# Patient Record
Sex: Female | Born: 1935 | Race: White | Hispanic: No | Smoking: Never smoker
Health system: Southern US, Community
[De-identification: ages and names within clinical notes are randomized; demographics above are authoritative.]

## PROBLEM LIST (undated history)

## (undated) DIAGNOSIS — E039 Hypothyroidism, unspecified: Secondary | ICD-10-CM

## (undated) DIAGNOSIS — F329 Major depressive disorder, single episode, unspecified: Secondary | ICD-10-CM

## (undated) DIAGNOSIS — F0390 Unspecified dementia without behavioral disturbance: Secondary | ICD-10-CM

## (undated) DIAGNOSIS — I619 Nontraumatic intracerebral hemorrhage, unspecified: Secondary | ICD-10-CM

## (undated) DIAGNOSIS — R269 Unspecified abnormalities of gait and mobility: Secondary | ICD-10-CM

## (undated) DIAGNOSIS — R569 Unspecified convulsions: Secondary | ICD-10-CM

## (undated) DIAGNOSIS — R5381 Other malaise: Secondary | ICD-10-CM

## (undated) DIAGNOSIS — F32A Depression, unspecified: Secondary | ICD-10-CM

## (undated) DIAGNOSIS — I639 Cerebral infarction, unspecified: Secondary | ICD-10-CM

---

## 1998-03-10 ENCOUNTER — Other Ambulatory Visit: Admission: RE | Admit: 1998-03-10 | Discharge: 1998-03-10 | Payer: Self-pay | Admitting: Cardiology

## 2000-07-22 ENCOUNTER — Encounter: Payer: Self-pay | Admitting: Family Medicine

## 2000-07-22 ENCOUNTER — Encounter: Admission: RE | Admit: 2000-07-22 | Discharge: 2000-07-22 | Payer: Self-pay | Admitting: Family Medicine

## 2000-07-27 ENCOUNTER — Ambulatory Visit (HOSPITAL_COMMUNITY): Admission: RE | Admit: 2000-07-27 | Discharge: 2000-07-27 | Payer: Self-pay | Admitting: Family Medicine

## 2000-09-20 ENCOUNTER — Other Ambulatory Visit: Admission: RE | Admit: 2000-09-20 | Discharge: 2000-09-20 | Payer: Self-pay | Admitting: Family Medicine

## 2000-10-24 ENCOUNTER — Encounter: Payer: Self-pay | Admitting: Family Medicine

## 2000-10-24 ENCOUNTER — Encounter: Admission: RE | Admit: 2000-10-24 | Discharge: 2000-10-24 | Payer: Self-pay | Admitting: Family Medicine

## 2001-11-19 ENCOUNTER — Ambulatory Visit (HOSPITAL_COMMUNITY): Admission: RE | Admit: 2001-11-19 | Discharge: 2001-11-19 | Payer: Self-pay | Admitting: General Surgery

## 2001-11-19 ENCOUNTER — Encounter: Payer: Self-pay | Admitting: General Surgery

## 2002-01-04 ENCOUNTER — Encounter: Payer: Self-pay | Admitting: General Surgery

## 2002-01-04 ENCOUNTER — Encounter: Admission: RE | Admit: 2002-01-04 | Discharge: 2002-01-04 | Payer: Self-pay | Admitting: General Surgery

## 2002-01-07 ENCOUNTER — Encounter (INDEPENDENT_AMBULATORY_CARE_PROVIDER_SITE_OTHER): Payer: Self-pay | Admitting: *Deleted

## 2002-01-07 ENCOUNTER — Ambulatory Visit (HOSPITAL_BASED_OUTPATIENT_CLINIC_OR_DEPARTMENT_OTHER): Admission: RE | Admit: 2002-01-07 | Discharge: 2002-01-07 | Payer: Self-pay | Admitting: General Surgery

## 2002-09-12 ENCOUNTER — Encounter: Admission: RE | Admit: 2002-09-12 | Discharge: 2002-09-12 | Payer: Self-pay | Admitting: Family Medicine

## 2002-09-12 ENCOUNTER — Encounter: Payer: Self-pay | Admitting: Family Medicine

## 2003-12-16 ENCOUNTER — Encounter: Admission: RE | Admit: 2003-12-16 | Discharge: 2003-12-16 | Payer: Self-pay | Admitting: Family Medicine

## 2005-01-04 ENCOUNTER — Encounter: Admission: RE | Admit: 2005-01-04 | Discharge: 2005-01-04 | Payer: Self-pay | Admitting: Obstetrics and Gynecology

## 2007-10-06 ENCOUNTER — Ambulatory Visit: Payer: Self-pay | Admitting: Internal Medicine

## 2007-10-06 ENCOUNTER — Observation Stay (HOSPITAL_COMMUNITY): Admission: EM | Admit: 2007-10-06 | Discharge: 2007-10-06 | Payer: Self-pay | Admitting: Emergency Medicine

## 2010-03-20 ENCOUNTER — Emergency Department (HOSPITAL_COMMUNITY): Admission: EM | Admit: 2010-03-20 | Discharge: 2010-03-20 | Payer: Self-pay | Admitting: Emergency Medicine

## 2010-12-12 LAB — CBC
MCH: 33 pg (ref 26.0–34.0)
MCHC: 34.1 g/dL (ref 30.0–36.0)
MCV: 96.6 fL (ref 78.0–100.0)
Platelets: 173 10*3/uL (ref 150–400)
RBC: 3.68 MIL/uL — ABNORMAL LOW (ref 3.87–5.11)
RDW: 13.8 % (ref 11.5–15.5)
WBC: 4.3 10*3/uL (ref 4.0–10.5)

## 2010-12-12 LAB — URINE CULTURE: Colony Count: 50000

## 2010-12-12 LAB — BASIC METABOLIC PANEL
BUN: 18 mg/dL (ref 6–23)
CO2: 28 mEq/L (ref 19–32)
Calcium: 8.9 mg/dL (ref 8.4–10.5)
Chloride: 104 mEq/L (ref 96–112)
GFR calc Af Amer: >60 mL/min (ref >60)
GFR calc non Af Amer: >60 mL/min (ref >60)
Glucose, Bld: 90 mg/dL (ref 70–99)
Potassium: 4 mEq/L (ref 3.5–5.1)
Sodium: 137 mEq/L (ref 135–145)

## 2010-12-12 LAB — DIFFERENTIAL
Eosinophils Absolute: 0.1 10*3/uL (ref 0.0–0.7)
Eosinophils Relative: 2 % (ref 0–5)
Lymphs Abs: 0.9 10*3/uL (ref 0.7–4.0)
Monocytes Relative: 12 % (ref 3–12)
Neutro Abs: 2.7 10*3/uL (ref 1.7–7.7)

## 2010-12-12 LAB — URINALYSIS, ROUTINE W REFLEX MICROSCOPIC
Ketones, ur: NEGATIVE mg/dL
Nitrite: NEGATIVE
Protein, ur: NEGATIVE mg/dL

## 2010-12-12 LAB — URINE MICROSCOPIC-ADD ON

## 2010-12-12 LAB — VALPROIC ACID LEVEL: Valproic Acid Lvl: <10 ug/mL — ABNORMAL LOW (ref 50.0–100.0)

## 2011-02-08 NOTE — Discharge Summary (Signed)
NAMERONELLA, PLUNK                ACCOUNT NO.:  [id number]   MEDICAL RECORD NO.:  [id number]          PATIENT TYPE:  INP   LOCATION:  3017                         FACILITY:  [facility]   PHYSICIAN:  Corinna L. Lendell Caprice, MDDATE OF BIRTH:  1918-09-28   DATE OF ADMISSION:  10/06/2007  DATE OF DISCHARGE:  10/06/2007                               DISCHARGE SUMMARY   DISCHARGE DIAGNOSES:  1. Dementia with wandering and recent fall.  2. Questionable delirium, possibly worsened by Lyrica.  3. Urinary tract infection.  4. History of seizures.  5. Trigeminal neuralgia.  6. History of stroke.  7. Hypothyroidism.  8. Hyperlipidemia.   DISCHARGE MEDICATIONS:  1. Cipro 250 mg p.o. b.i.d. until gone.  2. Hold Lyrica.  3. She may continue the rest or outpatient medications which include      Synthroid 88 mcg a day.  4. Toprol XL 25 mg a day.  5. Dilantin 400 mg every Monday, Wednesday, Friday and 300 mg every      Tuesday, Thursday, Saturday, Sunday.  6. Aricept 10 mg a day.  7. Namenda 10 mg twice a day.  8. Aspirin 81 mg a day.  9. Aggrenox b.i.d.  10.Multivitamin a day.  11.Lipitor 40 mg a day.  12.Tylenol as needed for pain.   CONDITION:  Stable.   CONSULTATIONS:  None.   PROCEDURES:  None.   DIET:  Cardiac.   FOLLOW UP:  Follow up with neurologist or primary care physician as  needed.   ACTIVITY:  She is to have 24-hour supervision due to her wandering.   LABORATORY DATA:  TSH is pending.  INR is normal. CBC is significant for  a hemoglobin of 11.9, hematocrit 35, platelet count 144.  Urinalysis  showed negative nitrite, small leukocyte esterase, 3-6 white cells, many  bacteria.  A urine culture is pending.   Preliminary reading of CT brain was negative.   HISTORY AND HOSPITAL COURSE:  Ms. Deats is an 77 year old white female  who was placed on observation after having been found wandering in her  neighborhood.  She lives alone and had fallen several days ago and hit  her  head.  The daughters noticed that since Lyrica was started a month  ago, her confusion seems to be worsening.  The patient was confused and  no family members initially were available.  She was therefore admitted  to the hospital and subsequently switched to observation.  Please see  H&P for further admission details.  She was found to have a mild urinary  tract infection and was started on Cipro for this.  Also, I recommend  that her Lyrica be held for now.  Initially, initially there was no  information regarding the patient's medical history or medications.  However, when the daughters brought to medications in, it was noted that  the patient is on Dilantin.  The daughters think that she had a recent  level drawn.  Nevertheless, I have ordered a phenytoin level and an  albumin which are, at this time, pending.  I will follow these up.  I  have recommended  that the patient have 24-hour supervision and can no  longer be alone at home and they voiced understanding.      Corinna L. Lendell Caprice, MD  Electronically Signed     CLS/MEDQ  D:  10/06/2007  T:  10/07/2007  Job:  [id number]   cc:   Donia Guiles, M.D.  Georga Hacking, M.D.  Estanislado Pandy, MD

## 2011-02-08 NOTE — H&P (Signed)
Phyllis Reyes                ACCOUNT NO.:  [id number]   MEDICAL RECORD NO.:  [id number]          PATIENT TYPE:  INP   LOCATION:  3017                         FACILITY:  [facility]   PHYSICIAN:  Bevelyn Buckles. Bensimhon, MDDATE OF BIRTH:  [birth year]   DATE OF ADMISSION:  10/06/2007  DATE OF DISCHARGE:                              HISTORY & PHYSICAL   PRIMARY CARE PHYSICIAN:  Donia Guiles, M.D.   CARDIOLOGIST:  Georga Hacking, M.D.   REASON FOR ADMISSION:  Confusion/wandering/fall.   HISTORY OF PRESENT ILLNESS:  Phyllis Reyes is an 77 year old woman with a  history of dementia, coronary artery disease status post stenting,  hypothyroidism and chronic jaw neuralgia.  Apparently she recently had a  fall in her kitchen and saw Dr. Clovis Riley a couple of days ago.  Last  night she apparently was found wandering in the neighborhood and brought  to the emergency room.  She is quite a poor historian and cannot  actually tell me what she was doing but she was able to give me her  daughter's name and phone number Phyllis Reyes, [phone]) who informed  me of her past medical history.  Apparently Phyllis Reyes was recently  started on Lyrica for jaw neuralgia and there is some question of  whether that worsened her confusion.  In the ER she was found to have a  large right eye ecchymosis.  She underwent a head CT which was  apparently unremarkable.  She was also found to have a UTI and started  on ciprofloxacin.  She was hemodynamically stable.  Currently has no  complaints.   REVIEW OF SYSTEMS:  Unobtainable.   PAST MEDICAL HISTORY:  1. Coronary artery disease status post stent.  2. Dementia.  3. Hypothyroidism.  4. 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   MEDICATIONS:  Unknown.  I know she does take Lyrica and Synthroid.  Her  daughter will bring the rest of the medications.   ALLERGIES:  Unknown.   SOCIAL HISTORY:  She lives alone but is watched closely by her  daughter.  She is a nonsmoker and nondrinker.   FAMILY HISTORY:  Is unobtainable.   PHYSICAL EXAM:  GENERAL:  She is an elderly woman in no acute distress.  She is confused.  VITAL SIGNS:  Temperature is 99.3, blood pressure 124/65, heart rate is  83.  HEENT:  She has a large right eye ecchymosis, otherwise normal.  NECK:  Neck is supple.  There is no JVD.  No carotid bruits.  No  lymphadenopathies.  No thyromegaly.  CARDIAC:  She has a regular rate and rhythm with a very soft 2/6  systolic ejection murmur at the right sternal border.  No rub or gallop.  LUNGS:  Clear.  ABDOMEN:  Soft, nontender, nondistended.  There is no  hepatosplenomegaly.  No bruits.  No masses appreciated.  EXTREMITIES:  Warm with no cyanosis, clubbing or edema.  NEUROLOGIC:  She is confused.  She is nonfocal.  She moves all four  extremities without difficulty.  Cranial nerves are grossly intact.  Affect is pleasant.  LABS:  Urinalysis shows many bacteria, 3-6 white blood cells and  negative for nitrates and small leukocyte esterase.  White count is 9.4,  hemoglobin is 11.8, platelets are 142.  Sodium 140, potassium 3.9,  chloride 107, BUN of 1,0, glucose of 124.   ASSESSMENT:  1. Dementia/delirium, unsure of her baseline.  2. Urinary tract infection.  3. A recent fall with right eye ecchymosis, head CT negative.   PLAN:  We will admit her, we will hydrate her, start ciprofloxacin and  await the results of her urine culture.  Her daughter will bring her  medications to the floor.  I do question the need for possibly higher  supervision either in an assisted living center or living with her  daughter.      Bevelyn Buckles. Bensimhon, MD  Electronically Signed     DRB/MEDQ  D:  10/06/2007  T:  10/06/2007  Job:  [id number]

## 2011-02-11 NOTE — Op Note (Signed)
[facility]  Patient:    Phyllis Reyes Visit Number: [id number] MRN: [id number]          Service Type: DSU Location: [facility] Attending Physician:  Henrene Dodge Dictated by:   Anselm Pancoast. Zachery Dakins, M.D. Proc. Date: 01/07/02 Admit Date:  01/07/2002                             Operative Report  PREOPERATIVE DIAGNOSIS:   Neuroma left calf lateral.  POSTOPERATIVE DIAGNOSIS:  Neuroma left calf lateral.  OPERATION:  Excision of neuroma with neurolysis left calf lateral compartment.  SURGEON:  Anselm Pancoast. Zachery Dakins, M.D.  ANESTHESIA:  Local with sedation.  INDICATIONS:  Phyllis Reyes is an 77 year old Caucasian female referred to me approximately four months ago with a little tender area in the left lower leg that I thought on first examination was a little localized area of thrombophlebitis.  I treated her with aspirin and compressive stockings.  When I saw her approximately three weeks later the area appeared to be larger and we proceeded on with an MRI and documented that this was a neuroma.  The area if you touched it would have pain shooting to her leg, the dorsum of her left foot and because of family problems, surgery has been postponed because of other activities she is involved in.  She is now for excision for this.  On physical exam, the area is a little bit larger than 3 cm in its greatest diameter.  She is aware as well as her family members that she may have numbness and possibly a weakness in the extension of her left foot, but hopefully we can peel this mass off of the nerve without actually sacrificing the nerve.  DESCRIPTION OF PROCEDURE:  The patient was taken to the operative suite, given 1 g of Kefzol and then the foot prepped circumferentially with Betadine solution and then draped so that I could rotate the leg.  The area was a field block with 0.5% Marcaine with 0.25 Adrenalin in the area and then the  little vertical and skin incision area right over the mass was infiltrated.  An incision was made about 2-1/2 inches in length and then the underlying subcutaneous tissues was spread, and then the fascia was anesthetized after anesthetizing it.  The neuroma could easily be separated from the muscles in the lateral compartment. Next I sort of identified the nerve going into it. The tumor itself was actually going under the nerve and I kind of rotated it out and then with magnification actually sacrificed only a few of the little nerve fibers so that this could be peeled off the nerve that was kind of splayed over it.  There was very little bleeding.  No sutures or Bovie was used on the nerve.  Then with the area completely excised it measures about 3.3 x 2 x 2.2. cm.  I sent it for permanent exam.  If this would recur, we will definitely sacrifice the nerve completely the next time, but it looks benign and it has obviously been there for a significant length of time according to the patient.  For the patients incision, I closed the fascia with interrupted sutures of 4-0 Vicryl and then the skin was closed with 4-0 nylon.  I did place her in a head-up position and a Trendelenburg just to let her strain to see if there would be any venous bleeding, and there  does not appear to be any.  A little antibiotic ointment plus a Kerlix plus a 3 inch Ace was placed on the leg. She is aware that she will need to be kind of minimal activity for about the next two to three days.  I will see her in the office for a wound check later this week and will remove the stitches in approximately 10 days. Dictated by:   Anselm Pancoast. Zachery Dakins, M.D. Attending Physician:  Henrene Dodge DD:  01/07/02 TD:  01/07/02 Job: [id number]

## 2011-06-15 LAB — URINE CULTURE: Colony Count: >=100000

## 2011-06-15 LAB — CBC
MCV: 93.7
RBC: 3.72 — ABNORMAL LOW
RDW: 13.3
WBC: 9.4

## 2011-06-15 LAB — PROTIME-INR: INR: 1

## 2011-06-15 LAB — I-STAT 8, (EC8 V) (CONVERTED LAB)
Acid-Base Excess: 1
Bicarbonate: 26.9 — ABNORMAL HIGH
Chloride: 107
Glucose, Bld: 124 — ABNORMAL HIGH
HCT: 35 — ABNORMAL LOW
Hemoglobin: 11.9 — ABNORMAL LOW
Sodium: 140

## 2011-06-15 LAB — ALBUMIN: Albumin: 3.7

## 2011-06-15 LAB — URINALYSIS, ROUTINE W REFLEX MICROSCOPIC
Glucose, UA: NEGATIVE
Nitrite: NEGATIVE

## 2011-06-15 LAB — T4, FREE: Free T4: 0.9

## 2011-06-15 LAB — URINE MICROSCOPIC-ADD ON

## 2011-11-15 IMAGING — CT CT HEAD W/O CM
4 of 6 series · 17 of 47 positions shown, 19 images · non-contrast
Comparison: 10/06/2007.

CT HEAD

CLINICAL DATA: Fall.  Pain.  Bruising over the right forehead.

CT HEAD WITHOUT CONTRAST
CT CERVICAL SPINE WITHOUT CONTRAST
TECHNIQUE: Multidetector CT imaging of the head and cervical spine
was performed following the standard protocol without intravenous
contrast.  Multiplanar CT image reconstructions of the cervical
spine were also generated.

[Series 3: head trauma 2.4 h60s · axial · 0.45mm/px · z∈[-122,-50]mm · 4 of 72 slices shown]
[im 11/72  brain]
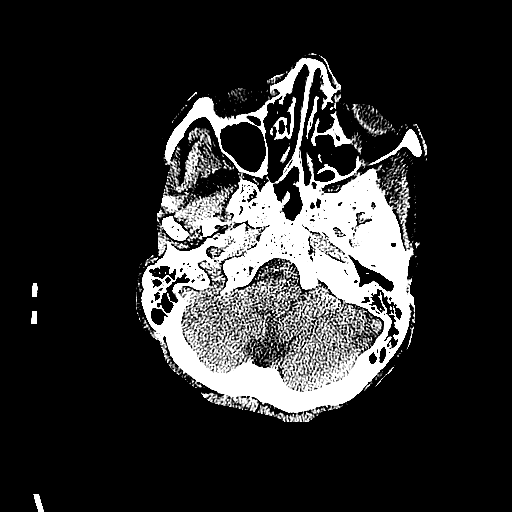
[im 21/72  brain]
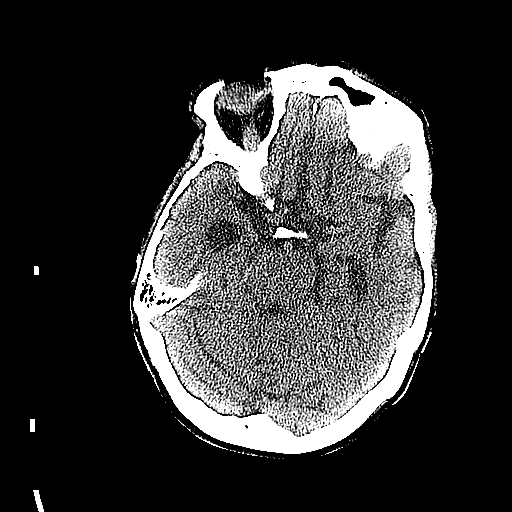
[im 31/72  brain]
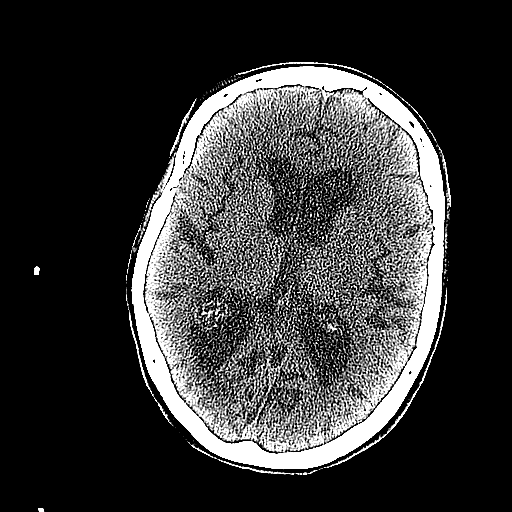
[im 41/72  brain]
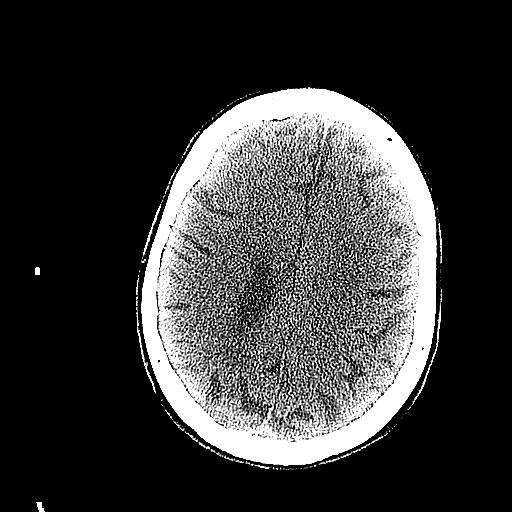

[Series 602: cor · coronal · 0.34mm/px · 3 of 41 slices shown]
[im 14/41  brain]
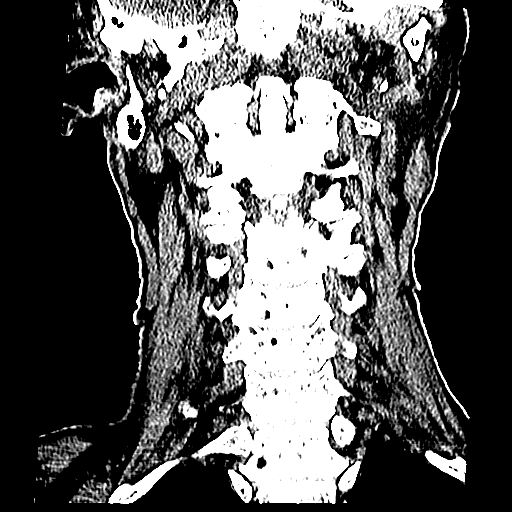
[im 18/41  brain]
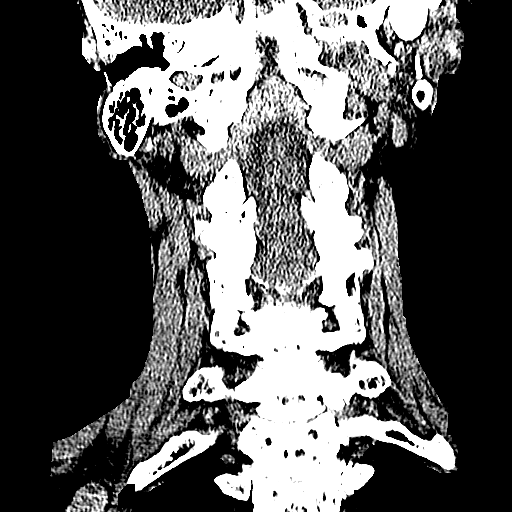
[im 23/41  brain]
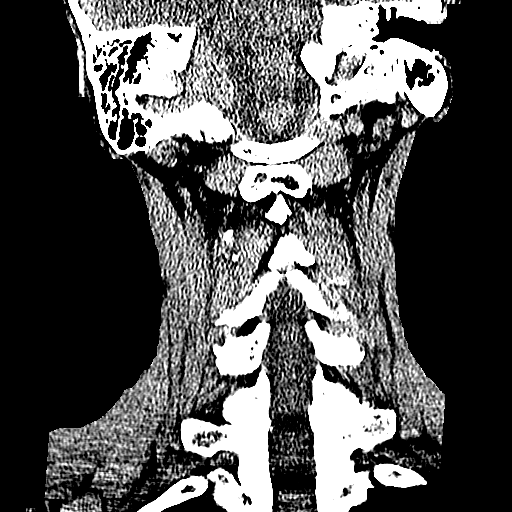

[Series 603: ortho ax · axial · 0.30mm/px · z∈[-296,-188]mm · 7 of 87 slices shown, 9 images]
[im 11/87  brain]
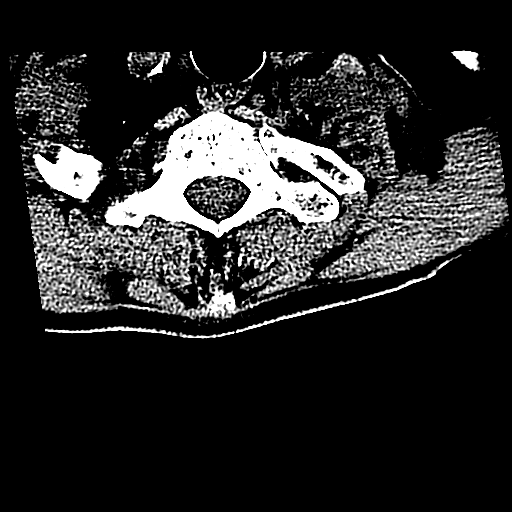
[im 11/87  bone]
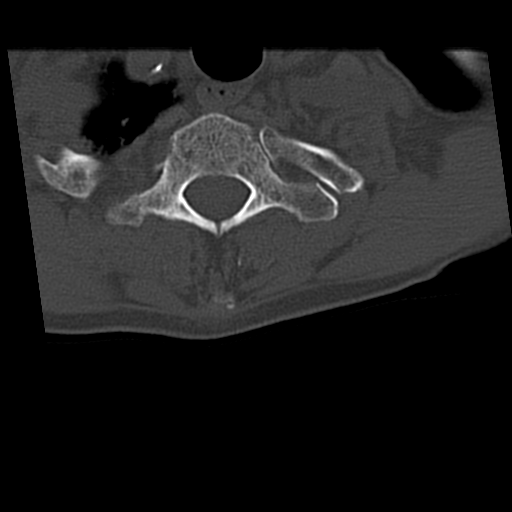
[im 22/87  brain]
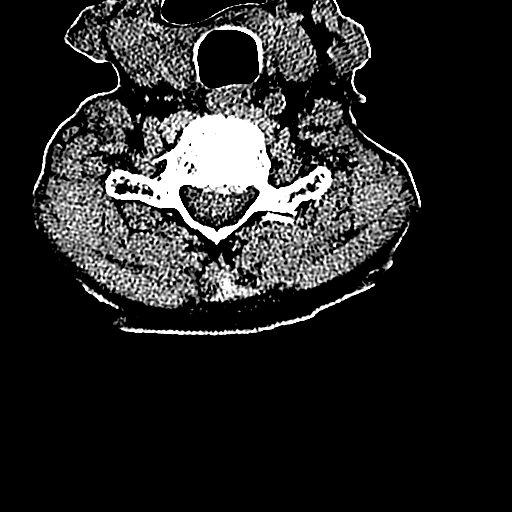
[im 33/87  brain]
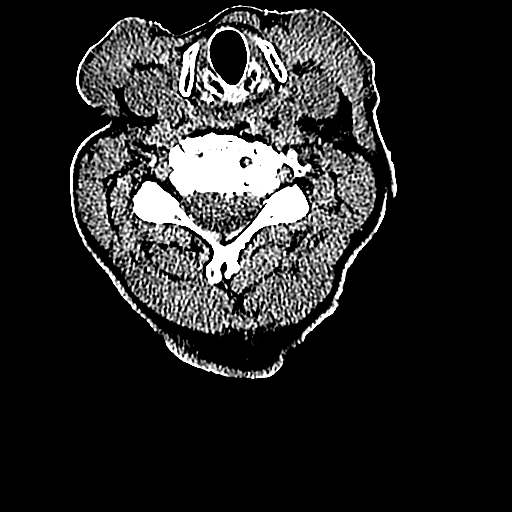
[im 44/87  brain]
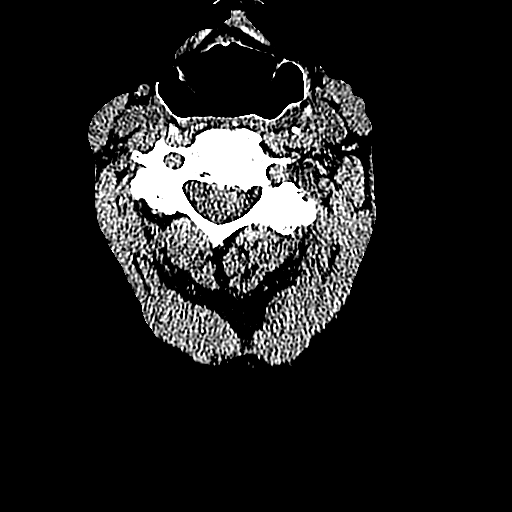
[im 54/87  brain]
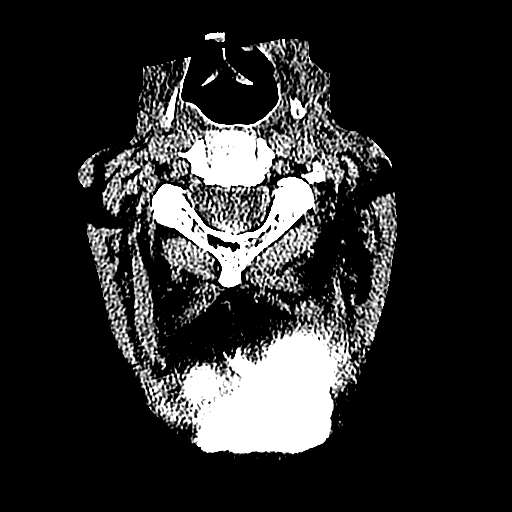
[im 54/87  bone]
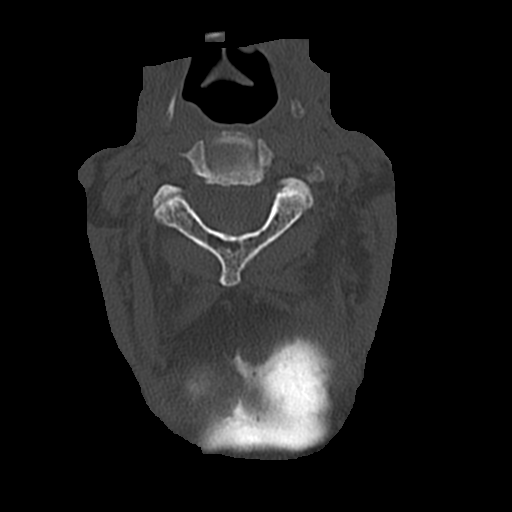
[im 65/87  brain]
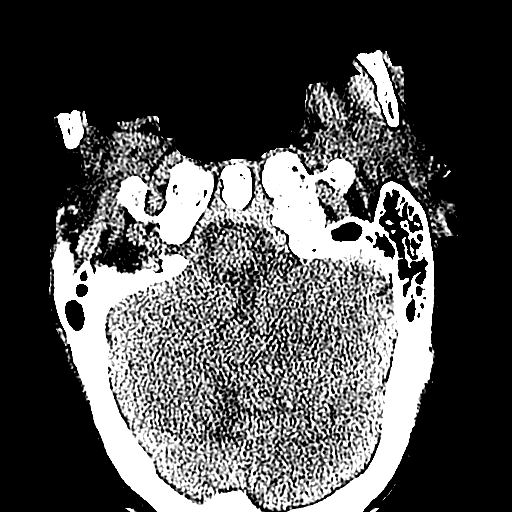
[im 76/87  brain]
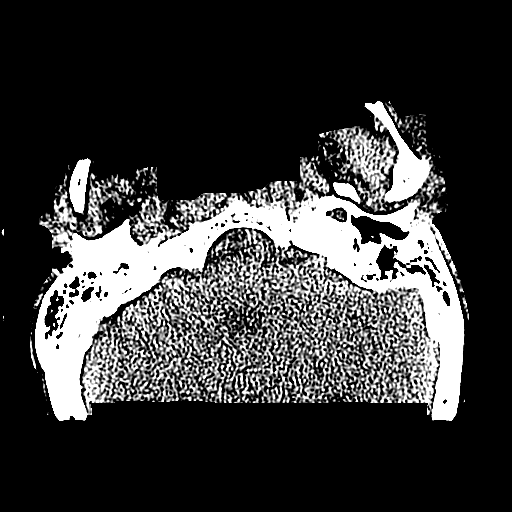

[Series 604: sag · sagittal · 0.34mm/px · 3 of 48 slices shown]
[im 16/48  brain]
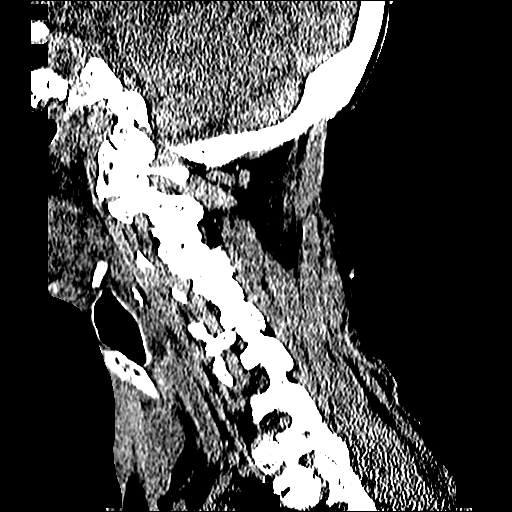
[im 24/48  brain]
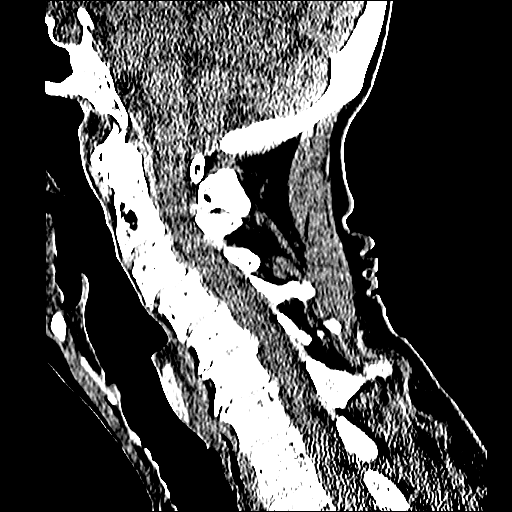
[im 32/48  brain]
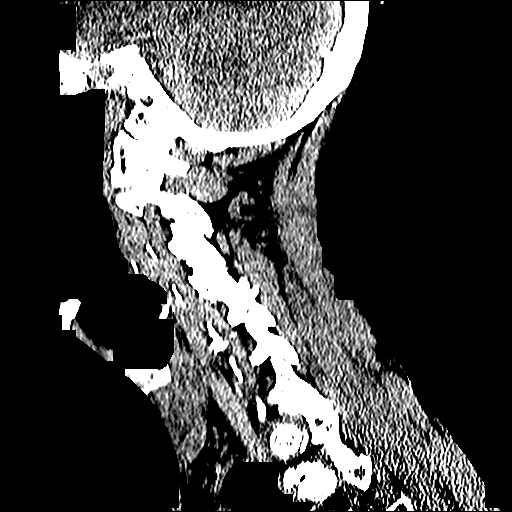

[17 of 47 positions shown; findings below may reference images not displayed]

FINDINGS: Moderate atrophy and white matter disease is stable.  No
acute cortical infarct, hemorrhage, mass, hydrocephalus, or
significant extra-axial fluid collection is present.  The paranasal
sinuses and mastoid air cells are clear.  A supraorbital right
frontal scalp hematoma is present without underlying fracture.
IMPRESSION: 1.  Stable atrophy and white matter disease.
2.  Minimal soft tissue swelling within the right frontal
supraorbital scalp without underlying fracture.

CT CERVICAL SPINE
FINDINGS: The cervical spine is imaged from skull base through the
midbody of T2.  Slight degenerative anterolisthesis of C4-5 is
stable.  The vertebral body heights are preserved.  No acute
fracture or traumatic subluxation is evident.  Moderate left
foraminal stenosis at C3-4 and C5-6 is stable.  Osseous right
foraminal stenosis at C7-T1 is stable, secondary to facet
arthropathy.  Multiple lytic lesions throughout the cervical spine
are relatively stable.
IMPRESSION: 1.  No acute fracture or traumatic subluxation.
2.  Stable moderate spondylosis of the cervical spine.
3.  Multiple lytic lesions throughout the cervical spine.  This
again raises concern for metastatic disease or myeloma.  However,
the findings have been relatively stable over last 2 years.  This
may be treated disease.

## 2012-08-09 ENCOUNTER — Encounter (HOSPITAL_COMMUNITY): Payer: Self-pay | Admitting: *Deleted

## 2012-08-09 ENCOUNTER — Emergency Department (HOSPITAL_COMMUNITY): Payer: Medicare Other

## 2012-08-09 ENCOUNTER — Emergency Department (HOSPITAL_COMMUNITY)
Admission: EM | Admit: 2012-08-09 | Discharge: 2012-08-09 | Disposition: A | Discharge status: Home / Self Care | Payer: Medicare Other | Attending: Emergency Medicine | Admitting: Emergency Medicine

## 2012-08-09 DIAGNOSIS — Y939 Activity, unspecified: Secondary | ICD-10-CM | POA: Insufficient documentation

## 2012-08-09 DIAGNOSIS — F329 Major depressive disorder, single episode, unspecified: Secondary | ICD-10-CM | POA: Insufficient documentation

## 2012-08-09 DIAGNOSIS — IMO0002 Reserved for concepts with insufficient information to code with codable children: Secondary | ICD-10-CM

## 2012-08-09 DIAGNOSIS — Y929 Unspecified place or not applicable: Secondary | ICD-10-CM | POA: Insufficient documentation

## 2012-08-09 DIAGNOSIS — F3289 Other specified depressive episodes: Secondary | ICD-10-CM | POA: Insufficient documentation

## 2012-08-09 DIAGNOSIS — Z79899 Other long term (current) drug therapy: Secondary | ICD-10-CM | POA: Insufficient documentation

## 2012-08-09 DIAGNOSIS — R296 Repeated falls: Secondary | ICD-10-CM | POA: Insufficient documentation

## 2012-08-09 DIAGNOSIS — Z23 Encounter for immunization: Secondary | ICD-10-CM | POA: Insufficient documentation

## 2012-08-09 DIAGNOSIS — W19XXXA Unspecified fall, initial encounter: Secondary | ICD-10-CM

## 2012-08-09 DIAGNOSIS — G40909 Epilepsy, unspecified, not intractable, without status epilepticus: Secondary | ICD-10-CM | POA: Insufficient documentation

## 2012-08-09 DIAGNOSIS — S0180XA Unspecified open wound of other part of head, initial encounter: Secondary | ICD-10-CM | POA: Insufficient documentation

## 2012-08-09 DIAGNOSIS — Z8669 Personal history of other diseases of the nervous system and sense organs: Secondary | ICD-10-CM | POA: Insufficient documentation

## 2012-08-09 DIAGNOSIS — Z8673 Personal history of transient ischemic attack (TIA), and cerebral infarction without residual deficits: Secondary | ICD-10-CM | POA: Insufficient documentation

## 2012-08-09 DIAGNOSIS — E039 Hypothyroidism, unspecified: Secondary | ICD-10-CM | POA: Insufficient documentation

## 2012-08-09 HISTORY — DX: Depression, unspecified: F32.A

## 2012-08-09 HISTORY — DX: Unspecified convulsions: R56.9

## 2012-08-09 HISTORY — DX: Other malaise: R53.81

## 2012-08-09 HISTORY — DX: Cerebral infarction, unspecified: I63.9

## 2012-08-09 HISTORY — DX: Major depressive disorder, single episode, unspecified: F32.9

## 2012-08-09 HISTORY — DX: Hypothyroidism, unspecified: E03.9

## 2012-08-09 HISTORY — DX: Unspecified dementia, unspecified severity, without behavioral disturbance, psychotic disturbance, mood disturbance, and anxiety: F03.90

## 2012-08-09 HISTORY — DX: Nontraumatic intracerebral hemorrhage, unspecified: I61.9

## 2012-08-09 HISTORY — DX: Unspecified abnormalities of gait and mobility: R26.9

## 2012-08-09 MED ORDER — TETANUS-DIPHTH-ACELL PERTUSSIS 5-2.5-18.5 LF-MCG/0.5 IM SUSP
0.5000 mL | Freq: Once | INTRAMUSCULAR | Status: AC
  Administered 2012-08-09: 0.5 mL via INTRAMUSCULAR
  Filled 2012-08-09: qty 0.5

## 2012-08-09 MED ORDER — LIDOCAINE HCL 2 % IJ SOLN
10.0000 mL | Freq: Once | INTRAMUSCULAR | Status: AC
  Administered 2012-08-09: 200 mg via INTRADERMAL

## 2012-08-09 NOTE — ED Provider Notes (Signed)
History     CSN: [id number]  Arrival date & time 08/09/12  7829   First MD Initiated Contact with Patient 08/09/12 1851      Chief Complaint  Patient presents with  . Head Laceration    (Consider location/radiation/quality/duration/timing/severity/associated sxs/prior treatment) Patient is a 78 y.o. female presenting with scalp laceration. The history is provided by the patient, the EMS personnel and the nursing home. The history is limited by the condition of the patient. No language interpreter was used.  Head Laceration This is a new problem. The current episode started today. Pertinent negatives include no fever.   Cc: 78 yo severly demented female coming from nursing home after she fell head first out of a w/c sustaining a 3cm laceration to R forehead.  Unable to communicate with patient due to dementia.  PEARL but no tracking or focusing.  The fall was witnessed by staff.  No seizure activity today.  pmh stroke , seizures, depression, dementia, hypothyroid, debility, ICH.  Patient is on keppra and depakote for seizures.     Past Medical History  Diagnosis Date  . Stroke   . Seizures   . Depression   . Dementia   . Hypothyroidism   . Intracerebral hemorrhage   . CVA (cerebral infarction)   . Debility   . Gait disorder     History reviewed. No pertinent past surgical history.  History reviewed. No pertinent family history.  History  Substance Use Topics  . Smoking status: Not on file  . Smokeless tobacco: Not on file  . Alcohol Use: No    OB History    Grav Para Term Preterm Abortions TAB SAB Ect Mult Living                  Review of Systems  Unable to perform ROS Constitutional: Negative for fever.    Allergies  Lorazepam  Home Medications   Current Outpatient Rx  Name  Route  Sig  Dispense  Refill  . AMOXICILLIN-POT CLAVULANATE 500-125 MG PO TABS   Oral   Take 1 tablet by mouth 2 (two) times daily. X 10 days. Started on 08/06/12         .  LACRI-LUBE S.O.P. OP OINT   Ophthalmic   Apply 1 application to eye 3 (three) times daily. To right eye         . DIVALPROEX SODIUM 125 MG PO CPSP   Oral   Take 750 mg by mouth at bedtime.         Marland Kitchen DOCUSATE SODIUM 50 MG/5ML PO LIQD   Oral   Take 100 mg by mouth 2 (two) times daily.         Marland Kitchen HYDROXYZINE HCL 25 MG PO TABS   Oral   Take 25 mg by mouth daily. At 2pm.         . IPRATROPIUM-ALBUTEROL 0.5-2.5 (3) MG/3ML IN SOLN   Nebulization   Take 3 mLs by nebulization every 6 (six) hours. X 7 days. Started on 08/06/12.         Marland Kitchen BOOST PLUS PO LIQD   Oral   Take 237 mLs by mouth 3 (three) times daily with meals.         Marland Kitchen LACTULOSE 10 GM/15ML PO SOLN   Oral   Take 30 g by mouth 2 (two) times daily.         Marland Kitchen LEVETIRACETAM 100 MG/ML PO SOLN   Oral   Take 500 mg by  mouth 2 (two) times daily.         Marland Kitchen LEVOTHYROXINE SODIUM 100 MCG PO TABS   Oral   Take 100 mcg by mouth daily.         Marland Kitchen LORATADINE 10 MG PO TABS   Oral   Take 10 mg by mouth daily.         Marland Kitchen MIRTAZAPINE 7.5 MG PO TABS   Oral   Take 7.5 mg by mouth at bedtime.         . ADULT MULTIVITAMIN LIQUID CH   Oral   Take 15 mLs by mouth daily.         Marland Kitchen PROBIOTIC DAILY PO   Oral   Take 1 tablet by mouth 2 (two) times daily. X 10 days. Started on 08/06/12.         Marland Kitchen BENEPROTEIN PO POWD   Oral   Take 1 scoop by mouth 2 (two) times daily.           BP 122/87  Pulse 83  Temp 98.6 F (37 C) (Oral)  Resp 18  SpO2 100%  Physical Exam  Nursing note and vitals reviewed. Constitutional: She is oriented to person, place, and time. She appears well-developed and well-nourished.  HENT:  Head: Normocephalic and atraumatic.  Eyes: Conjunctivae normal and EOM are normal. Pupils are equal, round, and reactive to light.  Neck: Normal range of motion. Neck supple.  Cardiovascular: Normal rate.   Pulmonary/Chest: Effort normal.  Abdominal: Soft.  Musculoskeletal: Normal range of motion.  She exhibits tenderness.  Neurological: She is alert and oriented to person, place, and time. She has normal reflexes.  Skin: Skin is warm and dry.       3cm laceration to R forehead  Psychiatric: She has a normal mood and affect.    ED Course  LACERATION REPAIR Date/Time: 08/09/2012 8:45 AM Performed by: Remi Haggard Authorized by: Remi Haggard Risks and benefits: risks, benefits and alternatives were discussed Consent given by: guardian Patient identity confirmed: verbally with patient, arm band, provided demographic data and hospital-assigned identification number Body area: head/neck Location details: forehead Laceration length: 3 cm Tendon involvement: none Nerve involvement: none Vascular damage: no Anesthesia: local infiltration Local anesthetic: lidocaine 2% without epinephrine Anesthetic total: 6 ml Patient sedated: no Preparation: Patient was prepped and draped in the usual sterile fashion. Irrigation solution: saline Amount of cleaning: standard Debridement: none Skin closure: 6-0 Prolene Number of sutures: 3 Technique: simple Approximation: close Approximation difficulty: simple Dressing: 4x4 sterile gauze Patient tolerance: Patient tolerated the procedure well with no immediate complications.   (including critical care time)  Labs Reviewed - No data to display No results found.   No diagnosis found.    MDM  Laceration after fall from w/c at nursing facility.  Severe dementia.  CT of head and cervical spine unremarkable and reviewed by myself. Laceration repair to L forehead.  Shared visit with Dr. Rulon Abide.  Ready for transport back to facility.          Remi Haggard, NP 08/10/12 [phone]

## 2012-08-09 NOTE — ED Notes (Signed)
Patient fell forward out of wheelchair.  Laceration to forehead, dressing intact on arrival.  No LOC- No other complaints

## 2012-08-09 NOTE — ED Notes (Addendum)
Pt returned from CT.  A.O. X 1. Daughter at bedside. Updated on plan of care: aware that films have been taken and EDP will need to read them. EDP will discuss results when available.  Pt laying in bed. Soft restraint in place. Skin warm and dry. Capillary refill positive. NAD. No further needs at this time.

## 2012-08-09 NOTE — ED Notes (Signed)
[facility] contacted for transport 

## 2012-08-11 NOTE — ED Provider Notes (Signed)
Medical screening examination/treatment/procedure(s) were performed by non-physician practitioner and as supervising physician I was immediately available for consultation/collaboration.  Jones Skene, M.D.     Jones Skene, MD 08/11/12 1425

## 2012-12-28 ENCOUNTER — Non-Acute Institutional Stay (SKILLED_NURSING_FACILITY): Payer: Medicare Other | Admitting: Internal Medicine

## 2012-12-28 DIAGNOSIS — H109 Unspecified conjunctivitis: Secondary | ICD-10-CM

## 2012-12-28 DIAGNOSIS — E039 Hypothyroidism, unspecified: Secondary | ICD-10-CM

## 2013-01-04 ENCOUNTER — Non-Acute Institutional Stay (SKILLED_NURSING_FACILITY): Payer: Medicare Other | Admitting: Internal Medicine

## 2013-01-04 DIAGNOSIS — F0150 Vascular dementia without behavioral disturbance: Secondary | ICD-10-CM

## 2013-01-04 DIAGNOSIS — R569 Unspecified convulsions: Secondary | ICD-10-CM

## 2013-01-04 DIAGNOSIS — E039 Hypothyroidism, unspecified: Secondary | ICD-10-CM

## 2013-01-04 DIAGNOSIS — J309 Allergic rhinitis, unspecified: Secondary | ICD-10-CM

## 2013-01-09 NOTE — Progress Notes (Signed)
Patient ID: Phyllis Reyes, female   DOB: [birth year], 79 y.o.   MRN: [id number]        PROGRESS NOTE  DATE:   12/28/2012    FACILITY:  [facility]   LEVEL OF CARE: SNF  Acute Visit  CHIEF COMPLAINT:  Manage hypothyroidism and bilateral eye discharge.   HISTORY OF PRESENT ILLNESS: I was requested by the staff to assess the patient regarding above problem(s):  HYPOTHYROIDISM: The hypothyroidism remains stable. No complications noted from the medications presently being used.  The staff denies fatigue or constipation.  Last TSH:  On 12/26/2012, TSH 0.366.  BILATERAL EYE DISCHARGE:  Staff report that patient has yellow, mucus-type drainage from bilateral eyes, noted for several days.  Staff cannot identify precipitating or alleviating factors.  Symptoms are constant.    PAST MEDICAL HISTORY : Reviewed.  No changes.  CURRENT MEDICATIONS: Reviewed per [facility]  REVIEW OF SYSTEMS:  Unobtainable due to dementia.   PHYSICAL EXAMINATION  GENERAL: no acute distress, thin body habitus EYES: There is bilateral yellow drainage from eyes.  No conjunctival erythema.  conjunctivae normal, sclerae normal, normal eye lids NECK: supple, trachea midline, no neck masses, no thyroid tenderness, no thyromegaly LYMPHATICS: no LAN in the neck, no supraclavicular LAN RESPIRATORY: breathing is even & unlabored, BS CTAB CARDIAC: RRR, no murmur,no extra heart sounds EDEMA/VARICOSITIES:  +1 bilateral lower extremity edema  ARTERIAL:  pedal pulses diminished  GI: abdomen soft, normal BS, no masses, no tenderness, no hepatomegaly, no splenomegaly PSYCHIATRIC: the patient is alert & oriented to person, affect & behavior appropriate  ASSESSMENT/PLAN:  Bilateral conjunctivitis.  New problem.  Start gentamicin ophthalmic 0.3%, 2 drops to each eye b.i.d. For one week.   Hypothyroidism.  Uncontrolled problem.  Decrease Synthroid to 75 mcg q.d.  Check TSH in six weeks.   CPT CODE: 40981

## 2013-01-22 NOTE — Progress Notes (Signed)
Patient ID: Phyllis Reyes, female   DOB: [birth year], 79 y.o.   MRN: [id number]        PROGRESS NOTE  DATE:  01/04/2013  FACILITY: [facility]   LEVEL OF CARE: SNF  Routine Visit  CHIEF COMPLAINT:  Manage seizure disorder and hypothyroidism.    HISTORY OF PRESENT ILLNESS:  REASSESSMENT OF ONGOING PROBLEM(S):  HYPOTHYROIDISM: The hypothyroidism remains stable. No complications noted from the medications presently being used.  The staff denies fatigue or constipation.  Last TSH:  In 12/2012, TSH was 0.366.  Patient is a poor historian due to dementia.   SEIZURE DISORDER: The patient's seizure disorder remains stable. No complications reported from the medications presently being used. Staff do not report any recent seizure activity.    PAST MEDICAL HISTORY : Reviewed.  No changes.  CURRENT MEDICATIONS: Reviewed per [facility]  REVIEW OF SYSTEMS:  Unobtainable due to dementia.   PHYSICAL EXAMINATION  VS:  T  97.5     P 76     RR 18      BP 102/70    POX %     WT (Lb) 130  GENERAL: no acute distress, thin body habitus NECK: supple, trachea midline, no neck masses, no thyroid tenderness, no thyromegaly RESPIRATORY: breathing is even & unlabored, BS CTAB CARDIAC: RRR, no murmur,no extra heart sounds, no edema GI: abdomen soft, normal BS, no masses, no tenderness, no hepatomegaly, no splenomegaly PSYCHIATRIC: the patient is alert & disoriented, decreased affect and mood   LABS/RADIOLOGY: Total protein 5.4, otherwise CMP normal.   Depakote level 32, Dilantin level less than 0.6.   09/2012:  CBC normal.    ASSESSMENT/PLAN:  Seizure disorder.  Well controlled.  Hypothyroidism.  Synthroid was decreased.  Recheck in six weeks pending.   Vascular dementia.  Advanced.   Currently Hospice patient.    Allergic rhinitis.  Continue Claritin.    Constipation.  Continue current laxatives.   Failure to thrive.  Continue nutritional supplements and Remeron.  Weight improved.   CPT CODE:  56213

## 2013-01-24 ENCOUNTER — Non-Acute Institutional Stay (SKILLED_NURSING_FACILITY): Payer: Medicare Other | Admitting: Adult Health

## 2013-01-24 ENCOUNTER — Encounter: Payer: Self-pay | Admitting: Adult Health

## 2013-01-24 DIAGNOSIS — K5900 Constipation, unspecified: Secondary | ICD-10-CM | POA: Insufficient documentation

## 2013-01-24 DIAGNOSIS — R569 Unspecified convulsions: Secondary | ICD-10-CM

## 2013-01-24 DIAGNOSIS — F411 Generalized anxiety disorder: Secondary | ICD-10-CM | POA: Insufficient documentation

## 2013-01-24 DIAGNOSIS — R627 Adult failure to thrive: Secondary | ICD-10-CM | POA: Insufficient documentation

## 2013-01-24 DIAGNOSIS — F0150 Vascular dementia without behavioral disturbance: Secondary | ICD-10-CM | POA: Insufficient documentation

## 2013-01-24 DIAGNOSIS — E039 Hypothyroidism, unspecified: Secondary | ICD-10-CM | POA: Insufficient documentation

## 2013-01-24 DIAGNOSIS — I639 Cerebral infarction, unspecified: Secondary | ICD-10-CM | POA: Insufficient documentation

## 2013-01-24 DIAGNOSIS — I6350 Cerebral infarction due to unspecified occlusion or stenosis of unspecified cerebral artery: Secondary | ICD-10-CM

## 2013-01-24 MED ORDER — LEVETIRACETAM 100 MG/ML PO SOLN: 750.0000 mg | Freq: Two times a day (BID) | ORAL | Status: AC

## 2013-01-24 NOTE — Progress Notes (Signed)
Patient ID: Phyllis Reyes, female   DOB: [birth year], 79 y.o.   MRN: [id number]  Chief Complaint  Patient presents with  . Acute Visit    seizure    HPI:   Staff reports that she is having some breakthrough seizure activity present. She is taking keppra 500 mg twice daily she is on depakote for her mood stabilization which is not therapeutic for seizure control she is unable to participate in the hpi or ros. She continues to be followed by hospice care   Past Medical History  Diagnosis Date  . Stroke   . Seizures   . Depression   . Dementia   . Hypothyroidism   . Intracerebral hemorrhage   . CVA (cerebral infarction)   . Debility   . Gait disorder     No past surgical history on file.  VITAL SIGNS BP 117/79  Pulse 68  Ht 5\' 5"  (1.651 m)  Wt 130 lb (58.968 kg)  BMI 21.63 kg/m2   Patient's Medications  New Prescriptions   No medications on file  Previous Medications   ARTIFICIAL TEAR OINTMENT (LACRI-LUBE S.O.P.) OINT    Apply 1 application to eye 3 (three) times daily. To right eye   DIVALPROEX (DEPAKOTE SPRINKLE) 125 MG CAPSULE    Take 500 mg by mouth 2 (two) times daily.    DOCUSATE (COLACE) 50 MG/5ML LIQUID    Take 100 mg by mouth 2 (two) times daily.   FUROSEMIDE (LASIX) 20 MG TABLET    Take 20 mg by mouth daily.   HYDROXYZINE (ATARAX/VISTARIL) 25 MG TABLET    Take 25 mg by mouth daily. At 2pm.   IPRATROPIUM-ALBUTEROL (DUONEB) 0.5-2.5 (3) MG/3ML SOLN    Take 3 mLs by nebulization every 6 (six) hours. X 7 days. Started on 08/06/12.   LACTOSE FREE NUTRITION (BOOST PLUS) LIQD    Take 237 mLs by mouth 3 (three) times daily with meals.   LACTULOSE (CHRONULAC) 10 GM/15ML SOLUTION    Take 30 mLs by mouth 2 (two) times daily.    LEVETIRACETAM (KEPPRA) 100 MG/ML SOLUTION    Take 500 mg by mouth 2 (two) times daily.   LEVOTHYROXINE (SYNTHROID, LEVOTHROID) 100 MCG TABLET    Take 75 mcg by mouth daily.    LORATADINE (CLARITIN) 10 MG TABLET    Take 10 mg by mouth daily.   MIRTAZAPINE (REMERON) 7.5 MG TABLET    Take 7.5 mg by mouth at bedtime.   MORPHINE (ROXANOL) 20 MG/ML CONCENTRATED SOLUTION    Take 5 mg by mouth every 4 (four) hours as needed for pain.   MULTIPLE VITAMIN (MULTIVITAMIN) LIQD    Take 15 mLs by mouth daily.   PROTEIN SUPPLEMENT (RESOURCE BENEPROTEIN) POWD    Take 1 scoop by mouth 2 (two) times daily.  Modified Medications   No medications on file  Discontinued Medications   AMOXICILLIN-CLAVULANATE (AUGMENTIN) 500-125 MG PER TABLET    Take 1 tablet by mouth 2 (two) times daily. X 10 days. Started on 08/06/12   PROBIOTIC PRODUCT (PROBIOTIC DAILY PO)    Take 1 tablet by mouth 2 (two) times daily. X 10 days. Started on 08/06/12.    SIGNIFICANT DIAGNOSTIC EXAMS  10-11-12: wbc 4.6; hgb 13.7; hct 40.6; mcv 89; plt 147  12-27-12: glucose 91; bun 2; creat 0.87; k+ 4.8; na++ 143; liver normal albumin 3.4; tsh 0.366 depakote 32(mood)  Review of Systems  Unable to perform ROS: dementia     Physical Exam  Constitutional: She appears  well-nourished.  Thin   Neck: Neck supple.  Respiratory: Effort normal and breath sounds normal.  GI: Soft. Bowel sounds are normal.  Musculoskeletal:  Able to move extremities  Neurological: She is alert.  Alert to self only  Skin: Skin is warm and dry.       ASSESSMENT/ PLAN:    Seizures Since she does have an occasional seizure will increase her keppra from 500 mg twice daily to 750 mg twice daily and will continue to monitor her status

## 2013-01-24 NOTE — Assessment & Plan Note (Signed)
Since she does have an occasional seizure will increase her keppra from 500 mg twice daily to 750 mg twice daily and will continue to monitor her status

## 2013-01-25 ENCOUNTER — Non-Acute Institutional Stay (SKILLED_NURSING_FACILITY): Payer: Medicare Other | Admitting: Internal Medicine

## 2013-01-25 DIAGNOSIS — J309 Allergic rhinitis, unspecified: Secondary | ICD-10-CM

## 2013-01-25 DIAGNOSIS — R569 Unspecified convulsions: Secondary | ICD-10-CM

## 2013-01-25 DIAGNOSIS — E039 Hypothyroidism, unspecified: Secondary | ICD-10-CM

## 2013-01-25 DIAGNOSIS — F0150 Vascular dementia without behavioral disturbance: Secondary | ICD-10-CM

## 2013-01-26 NOTE — Progress Notes (Signed)
Patient ID: Phyllis Reyes, female   DOB: 1919/03/12, 79 y.o.   MRN: [id number]        PROGRESS NOTE  DATE:  01/25/2013  FACILITY: [facility]   LEVEL OF CARE: SNF  Routine Visit  CHIEF COMPLAINT:  Manage seizure disorder and hypothyroidism.    HISTORY OF PRESENT ILLNESS:  REASSESSMENT OF ONGOING PROBLEM(S):  HYPOTHYROIDISM: The hypothyroidism remains stable. No complications noted from the medications presently being used.  The staff deny fatigue or constipation.  Last TSH:  In 12/2012, TSH was 0.366.  Patient is a poor historian due to dementia.   SEIZURE DISORDER: The patient's seizure disorder remains stable. No complications reported from the medications presently being used. Staff do not report any recent seizure activity.    PAST MEDICAL HISTORY : Reviewed.  No changes.  CURRENT MEDICATIONS: Reviewed per [facility]  REVIEW OF SYSTEMS:  Unobtainable due to dementia.   PHYSICAL EXAMINATION  VS:  T  98.1     P 94     RR 18      BP 136/90    POX %     WT (Lb) 130  GENERAL: no acute distress, thin body habitus NECK: supple, trachea midline, no neck masses, no thyroid tenderness, no thyromegaly RESPIRATORY: breathing is even & unlabored, BS CTAB CARDIAC: RRR, no murmur,no extra heart sounds, no edema GI: abdomen soft, normal BS, no masses, no tenderness, no hepatomegaly, no splenomegaly PSYCHIATRIC: the patient is alert & disoriented, decreased affect and mood   LABS/RADIOLOGY: 4/14 Total protein 5.4, otherwise CMP normal.   Depakote level 32, Dilantin level less than 0.6.   09/2012:  CBC normal.    ASSESSMENT/PLAN:  Seizure disorder.  Well controlled.  Keppra was increased.  Hypothyroidism.  Synthroid was decreased.  Recheck in six weeks pending.   Vascular dementia.  Advanced.   Currently Hospice patient.    Allergic rhinitis.  Continue Claritin.    Constipation.  Continue current laxatives.   Failure to thrive.  Continue nutritional supplements and Remeron.   Weight improved.   CPT CODE: 84696

## 2013-02-21 ENCOUNTER — Non-Acute Institutional Stay (SKILLED_NURSING_FACILITY): Payer: Medicare Other | Admitting: Adult Health

## 2013-02-21 DIAGNOSIS — R05 Cough: Secondary | ICD-10-CM

## 2013-02-27 ENCOUNTER — Non-Acute Institutional Stay (SKILLED_NURSING_FACILITY): Payer: Medicare Other | Admitting: Internal Medicine

## 2013-02-27 DIAGNOSIS — J309 Allergic rhinitis, unspecified: Secondary | ICD-10-CM

## 2013-02-27 DIAGNOSIS — F0150 Vascular dementia without behavioral disturbance: Secondary | ICD-10-CM

## 2013-02-27 DIAGNOSIS — E039 Hypothyroidism, unspecified: Secondary | ICD-10-CM

## 2013-02-27 DIAGNOSIS — R569 Unspecified convulsions: Secondary | ICD-10-CM

## 2013-02-27 NOTE — Progress Notes (Signed)
Patient ID: Phyllis Reyes, female   DOB: May 08, 1919, 79 y.o.   MRN: [id number]        PROGRESS NOTE  DATE:  02/27/2013  FACILITY: [facility]   LEVEL OF CARE: SNF  Routine Visit  CHIEF COMPLAINT:  Manage seizure disorder and hypothyroidism.    HISTORY OF PRESENT ILLNESS:  REASSESSMENT OF ONGOING PROBLEM(S):  HYPOTHYROIDISM: The hypothyroidism remains stable. No complications noted from the medications presently being used.  The staff deny fatigue or constipation.  Last TSH:  In 12/2012, TSH was 0.366.  Patient is a poor historian due to dementia.   SEIZURE DISORDER: The patient's seizure disorder remains stable. No complications reported from the medications presently being used. Staff do not report any recent seizure activity.    PAST MEDICAL HISTORY : Reviewed.  No changes.  CURRENT MEDICATIONS: Reviewed per [facility]  REVIEW OF SYSTEMS:  Unobtainable due to dementia.   PHYSICAL EXAMINATION  VS:  T  98.3     P 64     RR 16      BP 128/82   POX %     WT (Lb) 129  GENERAL: no acute distress, thin body habitus NECK: supple, trachea midline, no neck masses, no thyroid tenderness, no thyromegaly RESPIRATORY: breathing is even & unlabored, BS CTAB CARDIAC: RRR, no murmur,no extra heart sounds, no edema GI: abdomen soft, normal BS, no masses, no tenderness, no hepatomegaly, no splenomegaly PSYCHIATRIC: the patient is alert & disoriented, decreased affect and mood   LABS/RADIOLOGY: 4/14 Total protein 5.4, otherwise CMP normal.   Depakote level 32, Dilantin level less than 0.6.   09/2012:  CBC normal.    ASSESSMENT/PLAN:  Seizure disorder.  Well controlled.   Hypothyroidism.  Synthroid was decreased.  Recheck in six weeks pending.   Vascular dementia.  Advanced.   Currently Hospice patient.    Allergic rhinitis.  Continue Claritin.    Constipation.  Continue current laxatives.   Failure to thrive.  Continue nutritional supplements and Remeron.  Weight stable.   CPT  CODE: 95621

## 2013-03-06 ENCOUNTER — Non-Acute Institutional Stay (SKILLED_NURSING_FACILITY): Payer: Medicare Other | Admitting: Internal Medicine

## 2013-03-06 DIAGNOSIS — H109 Unspecified conjunctivitis: Secondary | ICD-10-CM

## 2013-03-06 DIAGNOSIS — R05 Cough: Secondary | ICD-10-CM

## 2013-03-20 ENCOUNTER — Non-Acute Institutional Stay (SKILLED_NURSING_FACILITY): Payer: Medicare Other | Admitting: Internal Medicine

## 2013-03-20 DIAGNOSIS — E039 Hypothyroidism, unspecified: Secondary | ICD-10-CM

## 2013-03-26 DIAGNOSIS — H109 Unspecified conjunctivitis: Secondary | ICD-10-CM | POA: Insufficient documentation

## 2013-03-26 DIAGNOSIS — R05 Cough: Secondary | ICD-10-CM | POA: Insufficient documentation

## 2013-03-26 NOTE — Progress Notes (Signed)
Patient ID: Phyllis Reyes, female   DOB: 22-Apr-1919, 79 y.o.   MRN: [id number]        PROGRESS NOTE  DATE: 03/06/2013  FACILITY:  [facility]  LEVEL OF CARE: SNF (31)  Acute Visit  CHIEF COMPLAINT:  Manage bilateral eye discharge and cough.    HISTORY OF PRESENT ILLNESS: I was requested by the staff to assess the patient regarding above problem(s):  BILATERAL EYE DISCHARGE:  Staff report that patient has been having yellow drainage from bilateral eyes for several days.  Staff identify precipitating or alleviating factors.  There is no temporal relationship.  There are no other associated signs and symptoms.  Patient does not follow commands due to advanced dementia.    COUGH:  Staff are also reporting a nonproductive cough for several days.  Staff do not report respiratory insufficiency, fever.  Staff cannot identify precipitating or alleviating factors.  There is no temporal relationship.    PAST MEDICAL HISTORY : Reviewed.  No changes.  CURRENT MEDICATIONS: Reviewed per [facility]  REVIEW OF SYSTEMS:  Unobtainable due to dementia.   PHYSICAL EXAMINATION  GENERAL: no acute distress, thin body habitus EYES: bilateral yellow discharge present, mild conjunctival erythema bilaterally, normal eye lids NECK: supple, trachea midline, no neck masses, no thyroid tenderness, no thyromegaly LYMPHATICS: no LAN in the neck, no supraclavicular LAN RESPIRATORY: breathing is even & unlabored, BS CTAB CARDIAC: RRR, no murmur,no extra heart sounds, no edema  ASSESSMENT/PLAN:  Bilateral conjunctivitis.  New problem.  Start gentamicin ophthalmic 0.3%, 2 drops to each eye b.i.d. for one week.    Cough.  New problem.  Start Mucinex 600 mg b.i.d. for one week.    CPT CODE: 46962

## 2013-04-04 ENCOUNTER — Non-Acute Institutional Stay (SKILLED_NURSING_FACILITY): Payer: Medicare Other | Admitting: Adult Health

## 2013-04-04 DIAGNOSIS — F0151 Vascular dementia with behavioral disturbance: Secondary | ICD-10-CM

## 2013-04-04 DIAGNOSIS — K5900 Constipation, unspecified: Secondary | ICD-10-CM

## 2013-04-04 DIAGNOSIS — R569 Unspecified convulsions: Secondary | ICD-10-CM

## 2013-04-04 DIAGNOSIS — R627 Adult failure to thrive: Secondary | ICD-10-CM

## 2013-04-04 DIAGNOSIS — E039 Hypothyroidism, unspecified: Secondary | ICD-10-CM

## 2013-04-04 DIAGNOSIS — F0150 Vascular dementia without behavioral disturbance: Secondary | ICD-10-CM

## 2013-04-04 DIAGNOSIS — R609 Edema, unspecified: Secondary | ICD-10-CM

## 2013-04-22 NOTE — Progress Notes (Signed)
Patient ID: Phyllis Reyes, female   DOB: 07-09-1919, 79 y.o.   MRN: [id number]        PROGRESS NOTE  DATE: 03/20/2013  FACILITY:  [facility]  LEVEL OF CARE: SNF (31)  Acute Visit  CHIEF COMPLAINT:  Manage hypothyroidism.    HISTORY OF PRESENT ILLNESS: I was requested by the staff to assess the patient regarding above problem(s):  HYPOTHYROIDISM: The hypothyroidism is unstable. No complications noted from the medications presently being used.  The staff denies fatigue or constipation.  Last TSH:  On 03/18/2013:  TSH 11.26.  Patient is a poor historian due to dementia.  PAST MEDICAL HISTORY : Reviewed.  No changes.  CURRENT MEDICATIONS: Reviewed per [facility]  REVIEW OF SYSTEMS:  Unobtainable due to dementia.   PHYSICAL EXAMINATION  GENERAL: no acute distress, normal body habitus NECK: supple, trachea midline, no neck masses, no thyroid tenderness, no thyromegaly RESPIRATORY: breathing is even & unlabored, BS CTAB CARDIAC: RRR, no murmur,no extra heart sounds EDEMA/VARICOSITIES:  +1 bilateral lower extremity edema  ARTERIAL:  pedal pulses diminished  GI: abdomen soft, normal BS, no masses, no tenderness, no hepatomegaly, no splenomegaly PSYCHIATRIC: the patient is alert & oriented to person, affect & behavior appropriate  ASSESSMENT/PLAN:  Hypothyroidism.  Uncontrolled problem.  Increase Synthroid to 100 mcg q.d.  Check TSH in six weeks.    CPT CODE: 78295

## 2013-05-20 ENCOUNTER — Non-Acute Institutional Stay (SKILLED_NURSING_FACILITY): Payer: Medicare Other | Admitting: Internal Medicine

## 2013-05-20 DIAGNOSIS — H109 Unspecified conjunctivitis: Secondary | ICD-10-CM

## 2013-05-22 ENCOUNTER — Non-Acute Institutional Stay (SKILLED_NURSING_FACILITY): Payer: Medicare Other | Admitting: Adult Health

## 2013-05-22 DIAGNOSIS — E039 Hypothyroidism, unspecified: Secondary | ICD-10-CM

## 2013-05-22 DIAGNOSIS — R627 Adult failure to thrive: Secondary | ICD-10-CM

## 2013-05-22 DIAGNOSIS — R569 Unspecified convulsions: Secondary | ICD-10-CM

## 2013-05-22 DIAGNOSIS — K5900 Constipation, unspecified: Secondary | ICD-10-CM

## 2013-05-22 DIAGNOSIS — R609 Edema, unspecified: Secondary | ICD-10-CM

## 2013-05-22 DIAGNOSIS — F0150 Vascular dementia without behavioral disturbance: Secondary | ICD-10-CM

## 2013-06-13 ENCOUNTER — Non-Acute Institutional Stay (SKILLED_NURSING_FACILITY): Payer: Medicare Other | Admitting: Internal Medicine

## 2013-06-13 DIAGNOSIS — E039 Hypothyroidism, unspecified: Secondary | ICD-10-CM

## 2013-06-13 DIAGNOSIS — J309 Allergic rhinitis, unspecified: Secondary | ICD-10-CM

## 2013-06-13 DIAGNOSIS — R569 Unspecified convulsions: Secondary | ICD-10-CM

## 2013-06-13 DIAGNOSIS — F0150 Vascular dementia without behavioral disturbance: Secondary | ICD-10-CM

## 2013-06-14 NOTE — Progress Notes (Signed)
Patient ID: Phyllis Reyes, female   DOB: 01-17-1919, 79 y.o.   MRN: [id number]        PROGRESS NOTE  DATE:  06/13/2013  [facility]AC[facility]   LEVEL OF CARE: SNF  Routine Visit  CHIEF COMPLAINT:  Manage seizure disorder and hypothyroidism.    HISTORY OF PRESENT ILLNESS:  REASSESSMENT OF ONGOING PROBLEM(S):  HYPOTHYROIDISM: The hypothyroidism remains stable. No complications noted from the medications presently being used.  The staff deny fatigue or constipation.  Last TSH:  In 12/2012, TSH was 0.366, in 8- 14 TSH 1.14.  Patient is a poor historian due to dementia.   SEIZURE DISORDER: The patient's seizure disorder remains stable. No complications reported from the medications presently being used. Staff do not report any recent seizure activity.    PAST MEDICAL HISTORY : Reviewed.  No changes.  CURRENT MEDICATIONS: Reviewed per [facility]  REVIEW OF SYSTEMS:  Unobtainable due to dementia.   PHYSICAL EXAMINATION  VS:  T  96.3     P 68     RR 18      BP 128/72   POX %     WT (Lb) 132  GENERAL: no acute distress, thin body habitus NECK: supple, trachea midline, no neck masses, no thyroid tenderness, no thyromegaly RESPIRATORY: breathing is even & unlabored, BS CTAB CARDIAC: RRR, no murmur,no extra heart sounds, no edema GI: abdomen soft, normal BS, no masses, no tenderness, no hepatomegaly, no splenomegaly PSYCHIATRIC: the patient is alert & disoriented, decreased affect and mood   LABS/RADIOLOGY:  7-14 platelets 146 otherwise CBC normal, total protein 5.7 otherwise CMP normal, Depakote level 30 4/14 Total protein 5.4, otherwise CMP normal.   Depakote level 32, Dilantin level less than 0.6.   09/2012:  CBC normal.    ASSESSMENT/PLAN:  Seizure disorder.  Well controlled.   Hypothyroidism.  Well controlled.  Vascular dementia.  Advanced.   Currently Hospice patient.    Allergic rhinitis.  Continue Claritin.    Constipation.  Continue current laxatives.   Failure to  thrive.  Continue nutritional supplements and Remeron.  Weight stable.   CPT CODE: 40981

## 2013-06-18 NOTE — Progress Notes (Signed)
Patient ID: Phyllis Reyes, female   DOB: [birth year], 79 y.o.   MRN: [id number]        PROGRESS NOTE  DATE: 05/20/2013  FACILITY:  [facility]  LEVEL OF CARE: SNF (31)  Acute Visit  CHIEF COMPLAINT:  Manage eye discharge.    HISTORY OF PRESENT ILLNESS: I was requested by the staff to assess the patient regarding above problem(s):  Hospice nurse reports that patient has a new eye discharge and thick sclerae noted for several days.  The patient does not follow commands due to advanced dementia.   Staff cannot identify precipitating or alleviating factors.  There is no temporal relationship.    PAST MEDICAL HISTORY : Reviewed.  No changes.  CURRENT MEDICATIONS: Reviewed per [facility]  REVIEW OF SYSTEMS:  Unobtainable due to dementia.    PHYSICAL EXAMINATION  VS:  T        P 72      RR 18      BP 124/60     POX %       WT (Lb)  GENERAL: no acute distress, normal body habitus EYES: mild conjunctival erythema and yellow drainage, right greater than left    NECK: supple, trachea midline, no neck masses, no thyroid tenderness, no thyromegaly LYMPHATICS: no LAN in the neck, no supraclavicular LAN RESPIRATORY: breathing is even & unlabored, BS CTAB  ASSESSMENT/PLAN:  Bilateral conjunctivitis.  New problem.  Start gentamicin ophthalmic 0.3%, 2 drops to each eye b.i.d. for one week.    CPT CODE: 44010

## 2013-07-11 ENCOUNTER — Non-Acute Institutional Stay (SKILLED_NURSING_FACILITY): Payer: Medicare Other | Admitting: Internal Medicine

## 2013-07-11 DIAGNOSIS — N179 Acute kidney failure, unspecified: Secondary | ICD-10-CM

## 2013-07-11 DIAGNOSIS — E039 Hypothyroidism, unspecified: Secondary | ICD-10-CM

## 2013-08-01 ENCOUNTER — Non-Acute Institutional Stay (SKILLED_NURSING_FACILITY): Payer: Medicare Other | Admitting: Internal Medicine

## 2013-08-01 DIAGNOSIS — F0150 Vascular dementia without behavioral disturbance: Secondary | ICD-10-CM

## 2013-08-01 DIAGNOSIS — R569 Unspecified convulsions: Secondary | ICD-10-CM

## 2013-08-01 DIAGNOSIS — E039 Hypothyroidism, unspecified: Secondary | ICD-10-CM

## 2013-08-01 DIAGNOSIS — J309 Allergic rhinitis, unspecified: Secondary | ICD-10-CM

## 2013-08-02 NOTE — Progress Notes (Signed)
Patient ID: Phyllis Reyes, female   DOB: Mar 16, 1919, 79 y.o.   MRN: [id number]        PROGRESS NOTE  DATE:  08/01/2013  [facility]AC[facility]   LEVEL OF CARE: SNF  Routine Visit  CHIEF COMPLAINT:  Manage seizure disorder and hypothyroidism.    HISTORY OF PRESENT ILLNESS:  REASSESSMENT OF ONGOING PROBLEM(S):  HYPOTHYROIDISM: The hypothyroidism remains stable. No complications noted from the medications presently being used.  The staff deny fatigue or constipation.  Last TSH:  In 12/2012, TSH was 0.366, in 8- 14 TSH 1.14, in 10-14 TSH 4.73.  SEIZURE DISORDER: The patient's seizure disorder remains stable. No complications reported from the medications presently being used. Staff do not report any recent seizure activity.    PAST MEDICAL HISTORY : Reviewed.  No changes.  CURRENT MEDICATIONS: Reviewed per [facility]  REVIEW OF SYSTEMS:  Unobtainable due to dementia.   PHYSICAL EXAMINATION  VS:  T  97.1     P 88     RR 18      BP 128/80  POX %     WT (Lb) 132  GENERAL: no acute distress, thin body habitus NECK: supple, trachea midline, no neck masses, no thyroid tenderness, no thyromegaly RESPIRATORY: breathing is even & unlabored, BS CTAB CARDIAC: RRR, no murmur,no extra heart sounds, +1 edema bilaterally GI: abdomen soft, normal BS, no masses, no tenderness, no hepatomegaly, no splenomegaly PSYCHIATRIC: the patient is alert & disoriented, decreased affect and mood   LABS/RADIOLOGY:  10-14 glc 110, cr 1.13, co2 14, tp 5.8 ow cmp nl, depakote level 6, dilantin level < 0.6  7-14 platelets 146 otherwise CBC normal, total protein 5.7 otherwise CMP normal, Depakote level 30 4/14 Total protein 5.4, otherwise CMP normal.   Depakote level 32, Dilantin level less than 0.6.   09/2012:  CBC normal.    ASSESSMENT/PLAN:  Seizure disorder.  Well controlled.   Hypothyroidism. TSH is increased.  Recheck in 8wks.  Vascular dementia.  Advanced.   Currently Hospice patient.    Allergic  rhinitis.  Continue Claritin.    Constipation.  Continue current laxatives.   Failure to thrive.  Continue nutritional supplements and Remeron.  Weight stable.   CPT CODE: 40981

## 2013-08-04 NOTE — Progress Notes (Signed)
Patient ID: Phyllis Reyes, female   DOB: 1918/10/01, 79 y.o.   MRN: [id number]        PROGRESS NOTE  DATE: 07/11/2013  FACILITY:  [facility]  LEVEL OF CARE: SNF (31)  Acute Visit  CHIEF COMPLAINT:  Manage acute renal failure and hypothyroidism.    HISTORY OF PRESENT ILLNESS: I was requested by the staff to assess the patient regarding above problem(s):  ACUTE RENAL FAILURE:  On 07/05/2013:  BUN 18, creatinine 1.13.  In 03/2013:  Creatinine 0.81.  Patient is on Lasix.  She is a poor historian due to dementia.   Staff do not report increasing lower extremity swelling or confusion.    HYPOTHYROIDISM: The hypothyroidism is slightly stable. No complications noted from the medications presently being used.  The staff denies fatigue or constipation.  Last TSH:  On 07/05/2013:  TSH 4.73.    PAST MEDICAL HISTORY : Reviewed.  No changes.  CURRENT MEDICATIONS: Reviewed per [facility]  REVIEW OF SYSTEMS:  Unobtainable due to dementia.    PHYSICAL EXAMINATION  GENERAL: no acute distress, thin body habitus NECK: supple, trachea midline, no neck masses, no thyroid tenderness, no thyromegaly RESPIRATORY: breathing is even & unlabored, BS CTAB CARDIAC: RRR, no murmur,no extra heart sounds, no edema GI: abdomen soft, normal BS, no masses, no tenderness, no hepatomegaly, no splenomegaly PSYCHIATRIC: the patient is alert, unable to assess orientation, decreased affect and mood    ASSESSMENT/PLAN:  Acute renal failure.  New problem.  Discontinue Lasix.    Hypothyroidism.  TSH improved.  Therefore, we will not change levothyroxine dose.    CPT CODE: 52841 (Hospice)

## 2013-08-05 DIAGNOSIS — N179 Acute kidney failure, unspecified: Secondary | ICD-10-CM | POA: Insufficient documentation

## 2013-08-27 ENCOUNTER — Encounter: Payer: Self-pay | Admitting: Adult Health

## 2013-08-27 NOTE — Progress Notes (Signed)
Patient ID: Phyllis Reyes, female   DOB: 07/25/1919, 79 y.o.   MRN: [id number]      Allergies  Allergen Reactions  . Lorazepam     Unknown reaction     Chief Complaint  Patient presents with  . Acute Visit    cough and congestion     HPI:  No problem-specific assessment & plan notes found for this encounter.   Past Medical History  Diagnosis Date  . Stroke   . Seizures   . Depression   . Dementia   . Hypothyroidism   . Intracerebral hemorrhage   . CVA (cerebral infarction)   . Debility   . Gait disorder     No past surgical history on file.  VITAL SIGNS BP 128/82  Pulse 64  Ht 5\' 5"  (1.651 m)  Wt 129 lb (58.514 kg)  BMI 21.47 kg/m2   Patient's Medications  New Prescriptions   No medications on file  Previous Medications   ARTIFICIAL TEAR OINTMENT (LACRI-LUBE S.O.P.) OINT    Apply 1 application to eye 3 (three) times daily. To right eye   DIVALPROEX (DEPAKOTE SPRINKLE) 125 MG CAPSULE    Take 500 mg by mouth 2 (two) times daily.    DOCUSATE (COLACE) 50 MG/5ML LIQUID    Take 100 mg by mouth 2 (two) times daily.   FUROSEMIDE (LASIX) 20 MG TABLET    Take 20 mg by mouth daily.   HYDROXYZINE (ATARAX/VISTARIL) 25 MG TABLET    Take 25 mg by mouth daily. At 2pm.   IPRATROPIUM-ALBUTEROL (DUONEB) 0.5-2.5 (3) MG/3ML SOLN    Take 3 mLs by nebulization every 6 (six) hours. X 7 days. Started on 08/06/12.   LACTOSE FREE NUTRITION (BOOST PLUS) LIQD    Take 237 mLs by mouth 3 (three) times daily with meals.   LACTULOSE (CHRONULAC) 10 GM/15ML SOLUTION    Take 30 mLs by mouth 2 (two) times daily.    LEVETIRACETAM (KEPPRA) 100 MG/ML SOLUTION    Take 7.5 mLs (750 mg total) by mouth 2 (two) times daily.   LEVOTHYROXINE (SYNTHROID, LEVOTHROID) 100 MCG TABLET    Take 75 mcg by mouth daily.    LORATADINE (CLARITIN) 10 MG TABLET    Take 10 mg by mouth daily.   MIRTAZAPINE (REMERON) 7.5 MG TABLET    Take 7.5 mg by mouth at bedtime.   MORPHINE (ROXANOL) 20 MG/ML CONCENTRATED SOLUTION     Take 5 mg by mouth every 4 (four) hours as needed for pain.   MULTIPLE VITAMIN (MULTIVITAMIN) LIQD    Take 15 mLs by mouth daily.   PROTEIN SUPPLEMENT (RESOURCE BENEPROTEIN) POWD    Take 1 scoop by mouth 2 (two) times daily.  Modified Medications   No medications on file  Discontinued Medications   No medications on file    SIGNIFICANT DIAGNOSTIC EXAMS Patient ID: Phyllis Reyes, female   DOB: 1919-03-16, 79 y.o.   MRN: [id number]  Chief Complaint  Patient presents with  . Acute Visit    cough and congestion     HPI:  Staff reports that she hs a cough present and congestion. She is unable to participate in the hpi or ros. There are no reports of fever present. Her lungs are clear. She continues to be followed by hospice care.    Past Medical History  Diagnosis Date  . Stroke   . Seizures   . Depression   . Dementia   . Hypothyroidism   . Intracerebral hemorrhage   .  CVA (cerebral infarction)   . Debility   . Gait disorder     No past surgical history on file.  VITAL SIGNS BP 128/82  Pulse 64  Ht 5\' 5"  (1.651 m)  Wt 129 lb (58.514 kg)  BMI 21.47 kg/m2   Patient's Medications  New Prescriptions   No medications on file  Previous Medications   ARTIFICIAL TEAR OINTMENT (LACRI-LUBE S.O.P.) OINT    Apply 1 application to eye 3 (three) times daily. To right eye   DIVALPROEX (DEPAKOTE SPRINKLE) 125 MG CAPSULE    Take 500 mg by mouth 2 (two) times daily.    DOCUSATE (COLACE) 50 MG/5ML LIQUID    Take 100 mg by mouth 2 (two) times daily.   FUROSEMIDE (LASIX) 20 MG TABLET    Take 20 mg by mouth daily.   HYDROXYZINE (ATARAX/VISTARIL) 25 MG TABLET    Take 25 mg by mouth daily. At 2pm.   IPRATROPIUM-ALBUTEROL (DUONEB) 0.5-2.5 (3) MG/3ML SOLN    Take 3 mLs by nebulization every 6 (six) hours. X 7 days. Started on 08/06/12.   LACTOSE FREE NUTRITION (BOOST PLUS) LIQD    Take 237 mLs by mouth 3 (three) times daily with meals.   LACTULOSE (CHRONULAC) 10 GM/15ML SOLUTION    Take  30 mLs by mouth 2 (two) times daily.    LEVETIRACETAM (KEPPRA) 100 MG/ML SOLUTION    Take 7.5 mLs (750 mg total) by mouth 2 (two) times daily.   LEVOTHYROXINE (SYNTHROID, LEVOTHROID) 100 MCG TABLET    Take 75 mcg by mouth daily.    LORATADINE (CLARITIN) 10 MG TABLET    Take 10 mg by mouth daily.   MIRTAZAPINE (REMERON) 7.5 MG TABLET    Take 7.5 mg by mouth at bedtime.   MORPHINE (ROXANOL) 20 MG/ML CONCENTRATED SOLUTION    Take 5 mg by mouth every 4 (four) hours as needed for pain.   MULTIPLE VITAMIN (MULTIVITAMIN) LIQD    Take 15 mLs by mouth daily.   PROTEIN SUPPLEMENT (RESOURCE BENEPROTEIN) POWD    Take 1 scoop by mouth 2 (two) times daily.  Modified Medications   No medications on file  Discontinued Medications   No medications on file    SIGNIFICANT DIAGNOSTIC EXAMS  10-11-12: wbc 4.6; hgb 13.7; hct 40.6; mcv 89; plt 147  12-27-12: glucose 91; bun 2; creat 0.87; k+ 4.8; na++ 143; liver normal albumin 3.4; tsh 0.366 depakote 32(mood)  Review of Systems  Unable to perform ROS: dementia     Physical Exam  Constitutional: She appears well-nourished.  Thin   HENT:  Mouth/Throat: No oropharyngeal exudate.  Neck: Neck supple. No JVD present.  Cardiovascular: Normal rate, regular rhythm and intact distal pulses.   Respiratory: Effort normal and breath sounds normal. No respiratory distress. She has no wheezes.  GI: Soft. Bowel sounds are normal. She exhibits no distension. There is no tenderness.  Musculoskeletal: She exhibits no edema.  Able to move extremities  Lymphadenopathy:    She has no cervical adenopathy.  Neurological: She is alert.  Alert to self only  Skin: Skin is warm and dry.     ASSESSMENT/PLAN  1. Cough: will begin her on robitussin 20 cc every 6 hours routinely for one week; will have nursing monitor her closely for signs of infection present if her status changes will begin her on abt at that time.

## 2013-09-03 NOTE — Progress Notes (Signed)
Patient ID: Phyllis Reyes, female   DOB: [birth year], 79 y.o.   MRN: [id number]     [facility]  Allergies  Allergen Reactions  . Lorazepam     Unknown reaction    Chief Complaint  Patient presents with  . Medical Managment of Chronic Issues    HPI She is being seen for the management of her chronic illnesses. She is followed by hospice care. There are no concerns being voiced by the nursing staff at this time. She cannot participate in the hpi or ros.   Past Medical History  Diagnosis Date  . Stroke   . Seizures   . Depression   . Dementia   . Hypothyroidism   . Intracerebral hemorrhage   . CVA (cerebral infarction)   . Debility   . Gait disorder     No past surgical history on file.  Filed Vitals:   04/04/13 1334  BP: 130/62  Pulse: 100  Height: 5\' 5"  (1.651 m)  Weight: 129 lb (58.514 kg)    MEDICATIONS claritin 10 mg daily mvi daily Lasix 20 mg daily depaktoe sprinkles 375 mg in the am and 500 mg in the pm Colace twice daliy Lactulose 30 cc twice daily keppra 750 mg twice daily remeron 7.5 mg nightly Synthroid 100 mcg daily Ativan 1mg  im prn seizure Atarax 25 mg bid prn itching duoneb every 6 hours prn      SIGNIFICANT DIAGNOSTIC EXAMS  10-11-12: wbc 4.6; hgb 13.7; hct 40.6; mcv 89; plt 147  12-27-12: glucose 91; bun 2; creat 0.87; k+ 4.8; na++ 143; liver normal albumin 3.4; tsh 0.366 depakote 32(mood)  Review of Systems  Unable to perform ROS: dementia     Physical Exam  Constitutional: She appears well-nourished.  Thin   HENT:  Mouth/Throat: No oropharyngeal exudate.  Neck: Neck supple. No JVD present.  Cardiovascular: Normal rate, regular rhythm and intact distal pulses.   Respiratory: Effort normal and breath sounds normal. No respiratory distress. She has no wheezes.  GI: Soft. Bowel sounds are normal. She exhibits no distension. There is no tenderness.  Musculoskeletal: She exhibits no edema.  Able to move extremities    Lymphadenopathy:    She has no cervical adenopathy.  Neurological: She is alert.  Alert to self only  Skin: Skin is warm and dry.    ASSESSMENT/PLAN  1. Seizure: no reports of seizure activity present. Will continue keppra 750 mg twice daily  2. Edema: is stable will continue lasix 20 mg daily no edema present at this time   3. Hypothyroidism: will continue synthroid 100 mcg daily  4. Constipation: will continue colace twice daily and lactulose 30 cc twice daily  5. Vascular dementia: no significant in status; will continue depakote 375 mg in the am and 500 mg nightly to help with mood state and will monitor  6. Ftt: her current weight is 129 pounds; her last albumin is 3.4. Will continue remeron 7.5 mg nightly to help with appetite.

## 2013-09-04 NOTE — Progress Notes (Signed)
Patient ID: Phyllis Reyes, female   DOB: 29-Mar-1919, 79 y.o.   MRN: [id number]     [facility]  Allergies  Allergen Reactions  . Lorazepam     Unknown reaction     HPI She is being seen for the management of her chronic illnesses. She is followed by hospice care. There are no concerns being voiced by the nursing staff at this time. She cannot participate in the hpi or ros.   Past Medical History  Diagnosis Date  . Stroke   . Seizures   . Depression   . Dementia   . Hypothyroidism   . Intracerebral hemorrhage   . CVA (cerebral infarction)   . Debility   . Gait disorder     No past surgical history on file.  Filed Vitals:   05/22/13 2208  BP: 114/68  Pulse: 70  Height: 5\' 5"  (1.651 m)  Weight: 132 lb (59.875 kg)     MEDICATIONS claritin 10 mg daily mvi daily Lasix 20 mg daily depaktoe sprinkles 375 mg in the am and 500 mg in the pm Colace twice daliy Lactulose 30 cc twice daily keppra 750 mg twice daily Synthroid 100 mcg daily Ativan 1mg  im prn seizure Atarax 25 mg bid prn itching duoneb every 6 hours prn      SIGNIFICANT DIAGNOSTIC EXAMS  10-11-12: wbc 4.6; hgb 13.7; hct 40.6; mcv 89; plt 147  12-27-12: glucose 91; bun 2; creat 0.87; k+ 4.8; na++ 143; liver normal albumin 3.4; tsh 0.366 depakote 32(mood) 04-11-13: wbc 4.2;hgb 12.2; hct 37.1; mcv 88; plt 146; glucose 81; bun 18; creat 0.81; k+4.2; na++147; liver normal albumin 3.5; tsh 2.780; depakote 30   Review of Systems  Unable to perform ROS: dementia     Physical Exam  Constitutional: She appears well-nourished.  Thin   HENT:  Mouth/Throat: No oropharyngeal exudate.  Neck: Neck supple. No JVD present.  Cardiovascular: Normal rate, regular rhythm and intact distal pulses.   Respiratory: Effort normal and breath sounds normal. No respiratory distress. She has no wheezes.  GI: Soft. Bowel sounds are normal. She exhibits no distension. There is no tenderness.  Musculoskeletal: She exhibits  no edema.  Able to move extremities  Lymphadenopathy:    She has no cervical adenopathy.  Neurological: She is alert.  Alert to self only  Skin: Skin is warm and dry.    ASSESSMENT/PLAN  1. Seizure: no reports of seizure activity present. Will continue keppra 750 mg twice daily  2. Edema: is stable will continue lasix 20 mg daily no edema present at this time   3. Hypothyroidism: will continue synthroid 100 mcg daily  4. Constipation: will continue colace twice daily and lactulose 30 cc twice daily  5. Vascular dementia: no significant in status; will continue depakote 375 mg in the am and 500 mg nightly to help with mood state and will monitor  6. Ftt: her current weight is 132 pounds; her last albumin is 3.5. Her remeron has been stopped; will not make changes will monitor her status.

## 2013-09-10 ENCOUNTER — Encounter: Payer: Self-pay | Admitting: Internal Medicine

## 2013-09-10 ENCOUNTER — Non-Acute Institutional Stay (SKILLED_NURSING_FACILITY): Payer: Medicare Other | Admitting: Internal Medicine

## 2013-09-10 DIAGNOSIS — J309 Allergic rhinitis, unspecified: Secondary | ICD-10-CM

## 2013-09-10 DIAGNOSIS — E039 Hypothyroidism, unspecified: Secondary | ICD-10-CM

## 2013-09-10 DIAGNOSIS — R569 Unspecified convulsions: Secondary | ICD-10-CM

## 2013-09-10 DIAGNOSIS — F0150 Vascular dementia without behavioral disturbance: Secondary | ICD-10-CM

## 2013-09-10 NOTE — Progress Notes (Signed)
Patient ID: Phyllis Reyes, female   DOB: 12/21/1918, 79 y.o.   MRN: [id number]        PROGRESS NOTE  DATE:  09/10/2013  FACILITY: [facility]   LEVEL OF CARE: SNF  Routine Visit  CHIEF COMPLAINT:  Manage seizure disorder and hypothyroidism.    HISTORY OF PRESENT ILLNESS:  REASSESSMENT OF ONGOING PROBLEM(S):  HYPOTHYROIDISM: The hypothyroidism remains stable. No complications noted from the medications presently being used.  The staff deny fatigue or constipation.  Last TSH:  In 12/2012, TSH was 0.366, in 8- 14 TSH 1.14, in 10-14 TSH 4.73, in 12-14 TSH 1.23.  SEIZURE DISORDER: The patient's seizure disorder remains stable. No complications reported from the medications presently being used. Staff do not report any recent seizure activity.    PAST MEDICAL HISTORY : Reviewed.  No changes.  CURRENT MEDICATIONS: Reviewed per [facility]  REVIEW OF SYSTEMS:  Unobtainable due to dementia.   PHYSICAL EXAMINATION  VS:  T  96.9     P 70     RR 16      BP 120/68  POX %     WT (Lb) 132  GENERAL: no acute distress, thin body habitus NECK: supple, trachea midline, no neck masses, no thyroid tenderness, no thyromegaly RESPIRATORY: breathing is even & unlabored, BS CTAB CARDIAC: RRR, no murmur,no extra heart sounds, +1 edema bilaterally GI: abdomen soft, normal BS, no masses, no tenderness, no hepatomegaly, no splenomegaly PSYCHIATRIC: the patient is alert & disoriented, decreased affect and mood   LABS/RADIOLOGY:  10-14 glc 110, cr 1.13, co2 14, tp 5.8 ow cmp nl, depakote level 6, dilantin level < 0.6  7-14 platelets 146 otherwise CBC normal, total protein 5.7 otherwise CMP normal, Depakote level 30 4/14 Total protein 5.4, otherwise CMP normal.   Depakote level 32, Dilantin level less than 0.6.   09/2012:  CBC normal.    ASSESSMENT/PLAN:  Seizure disorder.  Well controlled.   Hypothyroidism. Well controlled  Vascular dementia.  Advanced.   Currently Hospice patient.    Allergic  rhinitis.  Continue Claritin.    Constipation.  Continue current laxatives.   Failure to thrive.  Continue nutritional supplements and Remeron.  Weight stable.   CPT CODE: 40981

## 2013-10-01 ENCOUNTER — Non-Acute Institutional Stay (SKILLED_NURSING_FACILITY): Payer: Medicare Other | Admitting: Internal Medicine

## 2013-10-01 DIAGNOSIS — F0150 Vascular dementia without behavioral disturbance: Secondary | ICD-10-CM

## 2013-10-01 DIAGNOSIS — E039 Hypothyroidism, unspecified: Secondary | ICD-10-CM

## 2013-10-01 DIAGNOSIS — R509 Fever, unspecified: Secondary | ICD-10-CM

## 2013-10-01 DIAGNOSIS — R569 Unspecified convulsions: Secondary | ICD-10-CM

## 2013-10-02 DIAGNOSIS — R509 Fever, unspecified: Secondary | ICD-10-CM | POA: Insufficient documentation

## 2013-10-02 NOTE — Progress Notes (Signed)
Patient ID: Phyllis Reyes, female   DOB: [birth year], 80 y.o.   MRN: [id number]        PROGRESS NOTE  DATE:  10-01-13  FACILITY: [facility]   LEVEL OF CARE: SNF  Routine Visit  CHIEF COMPLAINT:  Manage fever, seizure disorder and hypothyroidism.    HISTORY OF PRESENT ILLNESS:  REASSESSMENT OF ONGOING PROBLEM(S):  FEVER: Staff reports that patient is having a low grade fever of 99.1 and crackles are heard in the lungs. She is also not eating. Symptoms of acute onset. Patient does not follow commands.  HYPOTHYROIDISM: The hypothyroidism remains stable. No complications noted from the medications presently being used.  The staff deny fatigue or constipation.  Last TSH:  In 12/2012, TSH was 0.366, in 8- 14 TSH 1.14, in 10-14 TSH 4.73, in 12-14 TSH 1.23.  SEIZURE DISORDER: The patient's seizure disorder remains stable. No complications reported from the medications presently being used. Staff do not report any recent seizure activity.    PAST MEDICAL HISTORY : Reviewed.  No changes.  [facility]  REVIEW OF SYSTEMS:  Unobtainable due to dementia.   PHYSICAL EXAMINATION  VS:  T  99.1     P 153     RR 16      BP 110/79  POX % 95    WT (Lb) 134  GENERAL: no acute distress, thin body habitus EYES: Unable to assess NECK: supple, trachea midline, no neck masses, no thyroid tenderness, no thyromegaly LYMPHATICS: No cervical lymphadenopathy, no supraclavicular lymphadenopathy RESPIRATORY: breathing is even & unlabored, BS diminished, crackles present CARDIAC: RRR, no murmur,no extra heart sounds, +1 edema bilaterally GI: abdomen soft, normal BS, no masses, no tenderness, no hepatomegaly, no splenomegaly PSYCHIATRIC: the patient is minimally alert & disoriented, decreased affect and mood   LABS/RADIOLOGY:  10-14 glc 110, cr 1.13, co2 14, tp 5.8 ow cmp nl, depakote level 6, dilantin level < 0.6  7-14 platelets 146 otherwise CBC normal, total protein 5.7  otherwise CMP normal, Depakote level 30 4/14 Total protein 5.4, otherwise CMP normal.   Depakote level 32, Dilantin level less than 0.6.   09/2012:  CBC normal.    ASSESSMENT/PLAN:        Fever-new problem. Would focus on keeping the patient comfortable.  will obtain a chest x-ray per RP request.  Seizure disorder.  Well controlled.   Hypothyroidism. Well controlled  Vascular dementia.  Advanced.   Currently Hospice patient.  Depakote was increased.  Allergic rhinitis.  Continue Claritin.    Constipation.  Continue current laxatives.   Failure to thrive.  Continue nutritional supplements and Remeron.  Weight stable.   CPT CODE: [id number]

## 2013-10-03 ENCOUNTER — Other Ambulatory Visit: Payer: Self-pay | Admitting: *Deleted

## 2013-10-03 MED ORDER — MORPHINE SULFATE (CONCENTRATE) 20 MG/ML PO SOLN: ORAL | Status: AC

## 2013-10-27 DEATH — deceased

## 2013-11-04 ENCOUNTER — Telehealth: Payer: Self-pay

## 2013-11-04 NOTE — Telephone Encounter (Signed)
[facility]
